# Patient Record
Sex: Female | Born: 1990
Health system: Southern US, Community
[De-identification: ages and names within clinical notes are randomized; demographics above are authoritative.]

## PROBLEM LIST (undated history)

## (undated) DIAGNOSIS — D649 Anemia, unspecified: Secondary | ICD-10-CM

## (undated) HISTORY — DX: Anemia, unspecified: D64.9

## (undated) HISTORY — PX: NO PAST SURGERIES: SHX2092

---

## 2013-08-01 ENCOUNTER — Encounter (HOSPITAL_BASED_OUTPATIENT_CLINIC_OR_DEPARTMENT_OTHER): Payer: Self-pay | Admitting: Emergency Medicine

## 2013-08-01 ENCOUNTER — Emergency Department (HOSPITAL_BASED_OUTPATIENT_CLINIC_OR_DEPARTMENT_OTHER)
Admission: EM | Admit: 2013-08-01 | Discharge: 2013-08-02 | Disposition: A | Discharge status: Home / Self Care | Payer: BC Managed Care – PPO | Attending: Emergency Medicine | Admitting: Emergency Medicine

## 2013-08-01 ENCOUNTER — Emergency Department (HOSPITAL_BASED_OUTPATIENT_CLINIC_OR_DEPARTMENT_OTHER): Payer: BC Managed Care – PPO

## 2013-08-01 DIAGNOSIS — J159 Unspecified bacterial pneumonia: Secondary | ICD-10-CM | POA: Insufficient documentation

## 2013-08-01 DIAGNOSIS — Z3202 Encounter for pregnancy test, result negative: Secondary | ICD-10-CM | POA: Insufficient documentation

## 2013-08-01 DIAGNOSIS — D509 Iron deficiency anemia, unspecified: Secondary | ICD-10-CM | POA: Insufficient documentation

## 2013-08-01 DIAGNOSIS — I498 Other specified cardiac arrhythmias: Secondary | ICD-10-CM | POA: Insufficient documentation

## 2013-08-01 DIAGNOSIS — D473 Essential (hemorrhagic) thrombocythemia: Secondary | ICD-10-CM | POA: Insufficient documentation

## 2013-08-01 DIAGNOSIS — J189 Pneumonia, unspecified organism: Secondary | ICD-10-CM

## 2013-08-01 DIAGNOSIS — D75839 Thrombocytosis, unspecified: Secondary | ICD-10-CM

## 2013-08-01 DIAGNOSIS — R000 Tachycardia, unspecified: Secondary | ICD-10-CM

## 2013-08-01 LAB — PREGNANCY, URINE: Preg Test, Ur: NEGATIVE

## 2013-08-01 MED ORDER — SODIUM CHLORIDE 0.9 % IV BOLUS (SEPSIS)
1000.0000 mL | Freq: Once | INTRAVENOUS | Status: AC
  Administered 2013-08-01: 1000 mL via INTRAVENOUS

## 2013-08-01 NOTE — ED Provider Notes (Signed)
CSN: [id number]     Arrival date & time 08/01/13  2222 History   First MD Initiated Contact with Patient 08/01/13 2237     Chief Complaint  Patient presents with  . elevated heart rate      (Consider location/radiation/quality/duration/timing/severity/associated sxs/prior Treatment) HPI  Karen Bender is a 23 y.o. female who is otherwise healthy complaining of palpitations, shortness of breath acute onset at approximately 9 PM tonight. Patient states she has not drank much liquids today. Patient states she felt like her heart stopped earlier today. Patient denies chest pain, calf pain or swelling, history of DVT or PE, nausea, vomiting, cough, fever, chills, nausea, vomiting, change in bowel or bladder habits, exogenous estrogen, recent long trips or surgeries.  History reviewed. No pertinent past medical history. History reviewed. No pertinent past surgical history. No family history on file. History  Substance Use Topics  . Smoking status: Never Smoker   . Smokeless tobacco: Not on file  . Alcohol Use: No   OB History   Grav Para Term Preterm Abortions TAB SAB Ect Mult Living                 Review of Systems  10 systems reviewed and found to be negative, except as noted in the HPI.   Allergies  Review of patient's allergies indicates no known allergies.  Home Medications   Prior to Admission medications   Not on File   BP 112/65  Pulse 119  Temp(Src) 98.3 F (36.8 C)  Resp 18  Ht 5\' 3"  (1.6 m)  Wt 80 lb (36.288 kg)  BMI 14.18 kg/m2  SpO2 100%  LMP 07/23/2013 Physical Exam  Nursing note and vitals reviewed. Constitutional: She is oriented to person, place, and time. She appears well-developed and well-nourished. No distress.  HENT:  Head: Normocephalic.  Mouth/Throat: Oropharynx is clear and moist.  Eyes: Conjunctivae and EOM are normal. Pupils are equal, round, and reactive to light.  Neck: Normal range of motion. Thyromegaly: .npmdm.  Cardiovascular:  Regular rhythm and intact distal pulses.   Tachycardic in the 120s  Pulmonary/Chest: Effort normal and breath sounds normal. No stridor. No respiratory distress. She has no wheezes. She has no rales. She exhibits no tenderness.  Abdominal: Soft. Bowel sounds are normal. She exhibits no distension and no mass. There is no tenderness. There is no rebound and no guarding.  Musculoskeletal: Normal range of motion. She exhibits no edema.  No calf asymmetry, superficial collaterals, palpable cords, edema, Homans sign negative bilaterally.    Neurological: She is alert and oriented to person, place, and time.  Psychiatric: She has a normal mood and affect.    ED Course  Procedures (including critical care time) Labs Review Labs Reviewed  CBC WITH DIFFERENTIAL - Abnormal; Notable for the following:    Hemoglobin 9.3 (*)    HCT 31.1 (*)    MCV 63.3 (*)    MCH 18.9 (*)    MCHC 29.9 (*)    RDW 16.5 (*)    Platelets 525 (*)    All other components within normal limits  BASIC METABOLIC PANEL - Abnormal; Notable for the following:    Glucose, Bld 107 (*)    All other components within normal limits  PREGNANCY, URINE    Imaging Review Dg Chest 2 View  08/01/2013   CLINICAL DATA:  Tachycardia with shortness of breath.  EXAM: CHEST  2 VIEW  COMPARISON:  None.  FINDINGS: The heart size and mediastinal contours are normal.  There is patchy right perihilar opacity on the frontal examination, not well seen on the lateral view, but probably in the middle lobe. The left lung is clear. There is no pleural effusion or pneumothorax. There is a narrow AP diameter of the chest with a probable pectus excavatum deformity. No acute osseous findings are evident. Telemetry leads overlie the chest.  IMPRESSION: Patchy right perihilar airspace disease suspicious for atelectasis or early pneumonia. No cardiomegaly or edema identified.   Electronically Signed   By: Camie Patience M.D.   On: 08/01/2013 23:57     EKG  Interpretation   Date/Time:  Sunday August 01 2013 23:09:13 EDT Ventricular Rate:  104 PR Interval:  158 QRS Duration: 82 QT Interval:  346 QTC Calculation: 454 R Axis:   47 Text Interpretation:  Sinus tachycardia Nonspecific T wave abnormality  Abnormal ECG No old tracing to compare Confirmed by [facility]  MD, DAVID  ([id number]) on 08/01/2013 11:19:56 PM      MDM   Final diagnoses:  None    Filed Vitals:   08/01/13 2233 08/01/13 2235 08/01/13 2327  BP:  112/65   Pulse: 119    Temp:   98.3 F (36.8 C)  Resp: 18    Height: 5\' 3"  (1.6 m)    Weight: 80 lb (36.288 kg)    SpO2: 100%      Medications  sodium chloride 0.9 % bolus 1,000 mL (1,000 mLs Intravenous New Bag/Given 08/01/13 2345)  cefTRIAXone (ROCEPHIN) 1 g in dextrose 5 % 50 mL IVPB (1 g Intravenous New Bag/Given 08/02/13 0042)  LORazepam (ATIVAN) injection 0.5 mg (not administered)  azithromycin (ZITHROMAX) tablet 500 mg (500 mg Oral Given 08/02/13 0041)  cefTRIAXone (ROCEPHIN) 1 G injection (  Duplicate 07/08/61 8937)    Karen Bender is a 23 y.o. female presenting with tachycardia and shortness of breath. Patient states she has not drink much liquid today and she has also had caffeinated tea. Physical exam is not consistent with DVT and patient has no risk factors.   Chest x-ray may show early right-sided pneumonia. Patient will be started on Rocephin and azithromycin.  Mild anemia at 9.3 over 31.1.  Patient's heart rate has increased to 160, will recheck EKG and give Ativan. MD aware, care transferred  Dr. Roxanne Mins at shift change       Monico Blitz, PA-C 08/02/13 [phone]

## 2013-08-01 NOTE — ED Notes (Signed)
Pt. States she was sitting at home when she started feeling her heart race and felt sob. Denies any recent long car rides or air travel. Denies any dizziness or chest pain. On arrival pt. States she feels a little sob. HR 119 on tele. Denies any new medications. States she drank tea with caffeine today.

## 2013-08-02 LAB — CBC WITH DIFFERENTIAL/PLATELET
Basophils Absolute: 0.1 10*3/uL (ref 0.0–0.1)
Basophils Relative: 1 % (ref 0–1)
Eosinophils Absolute: 0.2 10*3/uL (ref 0.0–0.7)
Eosinophils Relative: 2 % (ref 0–5)
HEMATOCRIT: 31.1 % — AB (ref 36.0–46.0)
Hemoglobin: 9.3 g/dL — ABNORMAL LOW (ref 12.0–15.0)
LYMPHS ABS: 2.1 10*3/uL (ref 0.7–4.0)
Lymphocytes Relative: 26 % (ref 12–46)
MCH: 18.9 pg — ABNORMAL LOW (ref 26.0–34.0)
MCHC: 29.9 g/dL — AB (ref 30.0–36.0)
MCV: 63.3 fL — ABNORMAL LOW (ref 78.0–100.0)
Monocytes Absolute: 0.6 10*3/uL (ref 0.1–1.0)
Monocytes Relative: 7 % (ref 3–12)
NEUTROS ABS: 4.9 10*3/uL (ref 1.7–7.7)
Neutrophils Relative %: 64 % (ref 43–77)
Platelets: 525 10*3/uL — ABNORMAL HIGH (ref 150–400)
RBC: 4.91 MIL/uL (ref 3.87–5.11)
RDW: 16.5 % — ABNORMAL HIGH (ref 11.5–15.5)
WBC: 7.9 10*3/uL (ref 4.0–10.5)

## 2013-08-02 LAB — BASIC METABOLIC PANEL
Anion gap: 13 (ref 5–15)
BUN: 10 mg/dL (ref 6–23)
CALCIUM: 10 mg/dL (ref 8.4–10.5)
CO2: 25 mEq/L (ref 19–32)
CREATININE: 0.7 mg/dL (ref 0.50–1.10)
Chloride: 102 mEq/L (ref 96–112)
GFR calc Af Amer: >90 mL/min (ref >90)
GFR calc non Af Amer: >90 mL/min (ref >90)
Glucose, Bld: 107 mg/dL — ABNORMAL HIGH (ref 70–99)
Potassium: 4 mEq/L (ref 3.7–5.3)
Sodium: 140 mEq/L (ref 137–147)

## 2013-08-02 MED ORDER — DEXTROSE 5 % IV SOLN
1.0000 g | Freq: Once | INTRAVENOUS | Status: AC
  Administered 2013-08-02: 1 g via INTRAVENOUS

## 2013-08-02 MED ORDER — AZITHROMYCIN 250 MG PO TABS: 1000.0000 mg | ORAL_TABLET | Freq: Once | ORAL | Status: DC

## 2013-08-02 MED ORDER — CEFTRIAXONE SODIUM 1 G IJ SOLR
INTRAMUSCULAR | Status: AC
  Filled 2013-08-02: qty 10

## 2013-08-02 MED ORDER — LORAZEPAM 2 MG/ML IJ SOLN
0.5000 mg | Freq: Once | INTRAMUSCULAR | Status: AC
Start: 2013-08-02 — End: 2013-08-02
  Administered 2013-08-02: 0.5 mg via INTRAVENOUS
  Filled 2013-08-02: qty 1

## 2013-08-02 MED ORDER — AMOXICILLIN 500 MG PO CAPS: 1000.0000 mg | ORAL_CAPSULE | Freq: Two times a day (BID) | ORAL | Status: DC

## 2013-08-02 MED ORDER — SODIUM CHLORIDE 0.9 % IV BOLUS (SEPSIS)
1000.0000 mL | Freq: Once | INTRAVENOUS | Status: AC
  Administered 2013-08-02: 1000 mL via INTRAVENOUS

## 2013-08-02 MED ORDER — AZITHROMYCIN 250 MG PO TABS
500.0000 mg | ORAL_TABLET | Freq: Once | ORAL | Status: AC
  Administered 2013-08-02: 500 mg via ORAL
  Filled 2013-08-02: qty 2

## 2013-08-02 NOTE — ED Notes (Signed)
Pt. Understands the importance of finding a PCP for primary care. Resource numbers given to pt.

## 2013-08-02 NOTE — ED Provider Notes (Signed)
23 year old female came in because of dyspnea and rapid heartbeat. On exam, lungs are clear heart is regular rate and rhythm although tachycardic. She is afebrile. Chest x-ray showed community acquired pneumonia she was started on appropriate antibiotics. However, because of persistent tachycardia, she was given aggressive IV hydration with progressive improvement in heart rate to the point where should her heart rate was now under 100. At no time did she appear toxic in any way. She is discharged with prescription for amoxicillin and is to be rechecked in the next 24-48 hours.  Medical screening examination/treatment/procedure(s) were conducted as a shared visit with non-physician practitioner(s) and myself.  I personally evaluated the patient during the encounter.   EKG Interpretation   Date/Time:  Sunday August 01 2013 23:09:13 EDT Ventricular Rate:  104 PR Interval:  158 QRS Duration: 82 QT Interval:  346 QTC Calculation: 676 R Axis:   47 Text Interpretation:  Sinus tachycardia Nonspecific T wave abnormality  Abnormal ECG No old tracing to compare Confirmed by [facility]  MD, Jacqui Headen  ([id number]) on 08/01/2013 11:19:56 PM       EKG Interpretation   Date/Time:  Monday August 02 2013 00:39:37 EDT Ventricular Rate:  134 PR Interval:  134 QRS Duration: 78 QT Interval:  284 QTC Calculation: 424 R Axis:   57 Text Interpretation:  Sinus tachycardia ST \\T \ T wave abnormality,  consider inferior ischemia Abnormal ECG When compared with ECG of  08/01/2013, No significant change was found Confirmed by [facility]  MD, Joy Haegele  ([id number]) on 08/02/2013 2:03:51 AM      Results for orders placed during the hospital encounter of 08/01/13  CBC WITH DIFFERENTIAL      Result Value Ref Range   WBC 7.9  4.0 - 10.5 K/uL   RBC 4.91  3.87 - 5.11 MIL/uL   Hemoglobin 9.3 (*) 12.0 - 15.0 g/dL   HCT 31.1 (*) 36.0 - 46.0 %   MCV 63.3 (*) 78.0 - 100.0 fL   MCH 18.9 (*) 26.0 - 34.0 pg   MCHC 29.9 (*) 30.0 - 36.0 g/dL   RDW  16.5 (*) 11.5 - 15.5 %   Platelets 525 (*) 150 - 400 K/uL   Neutrophils Relative % 64  43 - 77 %   Lymphocytes Relative 26  12 - 46 %   Monocytes Relative 7  3 - 12 %   Eosinophils Relative 2  0 - 5 %   Basophils Relative 1  0 - 1 %   Neutro Abs 4.9  1.7 - 7.7 K/uL   Lymphs Abs 2.1  0.7 - 4.0 K/uL   Monocytes Absolute 0.6  0.1 - 1.0 K/uL   Eosinophils Absolute 0.2  0.0 - 0.7 K/uL   Basophils Absolute 0.1  0.0 - 0.1 K/uL   RBC Morphology ELLIPTOCYTES     WBC Morphology WHITE COUNT CONFIRMED ON SMEAR     Smear Review PLATELET COUNT CONFIRMED BY SMEAR    BASIC METABOLIC PANEL      Result Value Ref Range   Sodium 140  137 - 147 mEq/L   Potassium 4.0  3.7 - 5.3 mEq/L   Chloride 102  96 - 112 mEq/L   CO2 25  19 - 32 mEq/L   Glucose, Bld 107 (*) 70 - 99 mg/dL   BUN 10  6 - 23 mg/dL   Creatinine, Ser 0.70  0.50 - 1.10 mg/dL   Calcium 10.0  8.4 - 10.5 mg/dL   GFR calc non Af Amer >90  >  90 mL/min   GFR calc Af Amer >90  >90 mL/min   Anion gap 13  5 - 15  PREGNANCY, URINE      Result Value Ref Range   Preg Test, Ur NEGATIVE  NEGATIVE   Dg Chest 2 View  08/01/2013   CLINICAL DATA:  Tachycardia with shortness of breath.  EXAM: CHEST  2 VIEW  COMPARISON:  None.  FINDINGS: The heart size and mediastinal contours are normal. There is patchy right perihilar opacity on the frontal examination, not well seen on the lateral view, but probably in the middle lobe. The left lung is clear. There is no pleural effusion or pneumothorax. There is a narrow AP diameter of the chest with a probable pectus excavatum deformity. No acute osseous findings are evident. Telemetry leads overlie the chest.  IMPRESSION: Patchy right perihilar airspace disease suspicious for atelectasis or early pneumonia. No cardiomegaly or edema identified.   Electronically Signed   By: Camie Patience M.D.   On: 08/01/2013 23:57    Images viewed by me.   Delora Fuel, MD 68/11/57 [id number]

## 2013-08-02 NOTE — Discharge Instructions (Signed)
You should be rechecked in the next 1-2 days. Return to the ED if symptoms are getting worse. Drink plenty of fluids.  Pneumonia, Adult Pneumonia is an infection of the lungs.  CAUSES Pneumonia may be caused by bacteria or a virus. Usually, these infections are caused by breathing infectious particles into the lungs (respiratory tract). SYMPTOMS   Cough.  Fever.  Chest pain.  Increased rate of breathing.  Wheezing.  Mucus production. DIAGNOSIS  If you have the common symptoms of pneumonia, your caregiver will typically confirm the diagnosis with a chest X-ray. The X-ray will show an abnormality in the lung (pulmonary infiltrate) if you have pneumonia. Other tests of your blood, urine, or sputum may be done to find the specific cause of your pneumonia. Your caregiver may also do tests (blood gases or pulse oximetry) to see how well your lungs are working. TREATMENT  Some forms of pneumonia may be spread to other people when you cough or sneeze. You may be asked to wear a mask before and during your exam. Pneumonia that is caused by bacteria is treated with antibiotic medicine. Pneumonia that is caused by the influenza virus may be treated with an antiviral medicine. Most other viral infections must run their course. These infections will not respond to antibiotics.  PREVENTION A pneumococcal shot (vaccine) is available to prevent a common bacterial cause of pneumonia. This is usually suggested for:  People over 25 years old.  Patients on chemotherapy.  People with chronic lung problems, such as bronchitis or emphysema.  People with immune system problems. If you are over 65 or have a high risk condition, you may receive the pneumococcal vaccine if you have not received it before. In some countries, a routine influenza vaccine is also recommended. This vaccine can help prevent some cases of pneumonia.You may be offered the influenza vaccine as part of your care. If you smoke, it is  time to quit. You may receive instructions on how to stop smoking. Your caregiver can provide medicines and counseling to help you quit. HOME CARE INSTRUCTIONS   Cough suppressants may be used if you are losing too much rest. However, coughing protects you by clearing your lungs. You should avoid using cough suppressants if you can.  Your caregiver may have prescribed medicine if he or she thinks your pneumonia is caused by a bacteria or influenza. Finish your medicine even if you start to feel better.  Your caregiver may also prescribe an expectorant. This loosens the mucus to be coughed up.  Only take over-the-counter or prescription medicines for pain, discomfort, or fever as directed by your caregiver.  Do not smoke. Smoking is a common cause of bronchitis and can contribute to pneumonia. If you are a smoker and continue to smoke, your cough may last several weeks after your pneumonia has cleared.  A cold steam vaporizer or humidifier in your room or home may help loosen mucus.  Coughing is often worse at night. Sleeping in a semi-upright position in a recliner or using a couple pillows under your head will help with this.  Get rest as you feel it is needed. Your body will usually let you know when you need to rest. SEEK IMMEDIATE MEDICAL CARE IF:   Your illness becomes worse. This is especially true if you are elderly or weakened from any other disease.  You cannot control your cough with suppressants and are losing sleep.  You begin coughing up blood.  You develop pain which is getting  worse or is uncontrolled with medicines.  You have a fever.  Any of the symptoms which initially brought you in for treatment are getting worse rather than better.  You develop shortness of breath or chest pain. MAKE SURE YOU:   Understand these instructions.  Will watch your condition.  Will get help right away if you are not doing well or get worse. Document Released: 12/31/2004 Document  Revised: 03/25/2011 Document Reviewed: 03/22/2010 [facility] Patient Information 2015 [place], [place]. This information is not intended to replace advice given to you by your health care provider. Make sure you discuss any questions you have with your health care provider.  Amoxicillin capsules or tablets What is this medicine? AMOXICILLIN (a mox i SIL in) is a penicillin antibiotic. It is used to treat certain kinds of bacterial infections. It will not work for colds, flu, or other viral infections. This medicine may be used for other purposes; ask your health care provider or pharmacist if you have questions. COMMON BRAND NAME(S): Amoxil, Moxilin, Sumox, Trimox What should I tell my health care provider before I take this medicine? They need to know if you have any of these conditions: -asthma -kidney disease -an unusual or allergic reaction to amoxicillin, other penicillins, cephalosporin antibiotics, other medicines, foods, dyes, or preservatives -pregnant or trying to get pregnant -breast-feeding How should I use this medicine? Take this medicine by mouth with a glass of water. Follow the directions on your prescription label. You may take this medicine with food or on an empty stomach. Take your medicine at regular intervals. Do not take your medicine more often than directed. Take all of your medicine as directed even if you think your are better. Do not skip doses or stop your medicine early. Talk to your pediatrician regarding the use of this medicine in children. While this drug may be prescribed for selected conditions, precautions do apply. Overdosage: If you think you have taken too much of this medicine contact a poison control center or emergency room at once. NOTE: This medicine is only for you. Do not share this medicine with others. What if I miss a dose? If you miss a dose, take it as soon as you can. If it is almost time for your next dose, take only that dose. Do not take  double or extra doses. What may interact with this medicine? -amiloride -birth control pills -chloramphenicol -macrolides -probenecid -sulfonamides -tetracyclines This list may not describe all possible interactions. Give your health care provider a list of all the medicines, herbs, non-prescription drugs, or dietary supplements you use. Also tell them if you smoke, drink alcohol, or use illegal drugs. Some items may interact with your medicine. What should I watch for while using this medicine? Tell your doctor or health care professional if your symptoms do not improve in 2 or 3 days. Take all of the doses of your medicine as directed. Do not skip doses or stop your medicine early. If you are diabetic, you may get a false positive result for sugar in your urine with certain brands of urine tests. Check with your doctor. Do not treat diarrhea with over-the-counter products. Contact your doctor if you have diarrhea that lasts more than 2 days or if the diarrhea is severe and watery. What side effects may I notice from receiving this medicine? Side effects that you should report to your doctor or health care professional as soon as possible: -allergic reactions like skin rash, itching or hives, swelling of the face,  lips, or tongue -breathing problems -dark urine -redness, blistering, peeling or loosening of the skin, including inside the mouth -seizures -severe or watery diarrhea -trouble passing urine or change in the amount of urine -unusual bleeding or bruising -unusually weak or tired -yellowing of the eyes or skin Side effects that usually do not require medical attention (report to your doctor or health care professional if they continue or are bothersome): -dizziness -headache -stomach upset -trouble sleeping This list may not describe all possible side effects. Call your doctor for medical advice about side effects. You may report side effects to FDA at 1-800-FDA-1088. Where  should I keep my medicine? Keep out of the reach of children. Store between 68 and 77 degrees F (20 and 25 degrees C). Keep bottle closed tightly. Throw away any unused medicine after the expiration date. NOTE: This sheet is a summary. It may not cover all possible information. If you have questions about this medicine, talk to your doctor, pharmacist, or health care provider.  2015, Elsevier/Gold Standard. (2007-03-24 14:10:59)

## 2013-08-19 ENCOUNTER — Ambulatory Visit: Payer: BC Managed Care – PPO | Admitting: Internal Medicine

## 2018-11-25 ENCOUNTER — Other Ambulatory Visit: Payer: Self-pay

## 2018-11-25 DIAGNOSIS — Z20822 Contact with and (suspected) exposure to covid-19: Secondary | ICD-10-CM

## 2018-11-27 LAB — NOVEL CORONAVIRUS, NAA: SARS-CoV-2, NAA: NOT DETECTED

## 2019-03-28 DIAGNOSIS — Z20828 Contact with and (suspected) exposure to other viral communicable diseases: Secondary | ICD-10-CM | POA: Diagnosis not present

## 2019-03-28 DIAGNOSIS — Z03818 Encounter for observation for suspected exposure to other biological agents ruled out: Secondary | ICD-10-CM | POA: Diagnosis not present

## 2019-05-04 DIAGNOSIS — J302 Other seasonal allergic rhinitis: Secondary | ICD-10-CM | POA: Diagnosis not present

## 2019-06-03 DIAGNOSIS — D509 Iron deficiency anemia, unspecified: Secondary | ICD-10-CM | POA: Diagnosis not present

## 2019-06-03 DIAGNOSIS — Z23 Encounter for immunization: Secondary | ICD-10-CM | POA: Diagnosis not present

## 2019-06-03 DIAGNOSIS — Z0000 Encounter for general adult medical examination without abnormal findings: Secondary | ICD-10-CM | POA: Diagnosis not present

## 2019-08-18 DIAGNOSIS — Z23 Encounter for immunization: Secondary | ICD-10-CM | POA: Diagnosis not present

## 2019-09-09 DIAGNOSIS — Z23 Encounter for immunization: Secondary | ICD-10-CM | POA: Diagnosis not present

## 2019-11-02 ENCOUNTER — Encounter: Payer: BLUE CROSS/BLUE SHIELD | Admitting: Certified Nurse Midwife

## 2019-11-03 ENCOUNTER — Other Ambulatory Visit: Payer: Self-pay

## 2019-11-03 ENCOUNTER — Other Ambulatory Visit (HOSPITAL_COMMUNITY)
Admission: RE | Admit: 2019-11-03 | Discharge: 2019-11-03 | Disposition: A | Discharge status: Home / Self Care | Payer: BLUE CROSS/BLUE SHIELD | Source: Ambulatory Visit | Attending: Obstetrics and Gynecology | Admitting: Obstetrics and Gynecology

## 2019-11-03 ENCOUNTER — Ambulatory Visit (INDEPENDENT_AMBULATORY_CARE_PROVIDER_SITE_OTHER): Payer: BLUE CROSS/BLUE SHIELD | Admitting: Obstetrics and Gynecology

## 2019-11-03 ENCOUNTER — Encounter: Payer: Self-pay | Admitting: Obstetrics and Gynecology

## 2019-11-03 VITALS — BP 120/76 | HR 108 | Ht 63.0 in | Wt 98.0 lb

## 2019-11-03 DIAGNOSIS — Z1329 Encounter for screening for other suspected endocrine disorder: Secondary | ICD-10-CM | POA: Diagnosis not present

## 2019-11-03 DIAGNOSIS — Z01419 Encounter for gynecological examination (general) (routine) without abnormal findings: Secondary | ICD-10-CM | POA: Diagnosis not present

## 2019-11-03 NOTE — Progress Notes (Signed)
    GYNECOLOGY ANNUAL PREVENTATIVE CARE ENCOUNTER NOTE  History:     Karen Bender is a 29 y.o. G0P0000 female here for a routine annual gynecologic exam.  Current complaints: None. First pap smear .   Denies abnormal vaginal bleeding, discharge, pelvic pain, problems with intercourse or other gynecologic concerns.    Gynecologic History Patient's last menstrual period was 10/12/2019. Contraception: none Last Pap: NA.  Last mammogram:NA  Obstetric History OB History  Gravida Para Term Preterm AB Living  0 0 0 0 0 0  SAB TAB Ectopic Multiple Live Births  0 0 0 0 0    Past Medical History:  Diagnosis Date  . Anemia     History reviewed. No pertinent surgical history.  Current Outpatient Medications on File Prior to Visit  Medication Sig Dispense Refill  . ferrous sulfate 325 (65 FE) MG tablet Take by mouth. (Patient not taking: Reported on 11/03/2019)     No current facility-administered medications on file prior to visit.    No Known Allergies  Social History:  reports that she has never smoked. She has never used smokeless tobacco. She reports that she does not drink alcohol and does not use drugs.  History reviewed. No pertinent family history.  The following portions of the patient's history were reviewed and updated as appropriate: allergies, current medications, past family history, past medical history, past social history, past surgical history and problem list.  Review of Systems Pertinent items noted in HPI and remainder of comprehensive ROS otherwise negative.  Physical Exam:  BP 120/76   Pulse (!) 108   Ht 5\' 3"  (1.6 m)   Wt 98 lb (44.5 kg)   LMP 10/12/2019   BMI 17.36 kg/m  CONSTITUTIONAL: Well-developed, well-nourished female in no acute distress.  HENT:  Normocephalic, atraumatic, External right and left ear normal. Oropharynx is clear and moist EYES: Conjunctivae and EOM are normal. Pupils are equal, round, and reactive to light. No scleral  icterus.  NECK: Normal range of motion, supple, no masses.  Normal thyroid.  SKIN: Skin is warm and dry. No rash noted. Not diaphoretic. No erythema. No pallor. MUSCULOSKELETAL: Normal range of motion. No tenderness.  No cyanosis, clubbing, or edema.  2+ distal pulses. NEUROLOGIC: Alert and oriented to person, place, and time. Normal reflexes, muscle tone coordination.  PSYCHIATRIC: Normal mood and affect. Normal behavior. Normal judgment and thought content. CARDIOVASCULAR: Normal heart rate noted, regular rhythm RESPIRATORY: Clear to auscultation bilaterally. Effort and breath sounds normal, no problems with respiration noted. BREASTS: Symmetric in size. No masses, tenderness, skin changes, nipple drainage, or lymphadenopathy bilaterally. Performed in the presence of a chaperone. ABDOMEN: Soft, no distention noted.  No tenderness, rebound or guarding.  PELVIC: Normal appearing external genitalia and urethral meatus; normal appearing vaginal mucosa and cervix.  No abnormal discharge noted.  Pap smear obtained.  Normal uterine size, no other palpable masses, no uterine or adnexal tenderness.  Performed in the presence of a chaperone.   Assessment and Plan:   1. Well woman exam  - Cytology - PAP( ) - TSH   Will follow up results of pap smear and manage accordingly. Routine preventative health maintenance measures emphasized. Please refer to After Visit Summary for other counseling recommendations.    Azzure Garabedian, Artist Pais, [place] for [employer], [place]

## 2019-11-03 NOTE — Progress Notes (Signed)
Pt would like to discuss getting pregnant Pt has never had pap smear

## 2019-11-04 LAB — TSH: TSH: 1.24 mIU/L

## 2019-11-04 LAB — CYTOLOGY - PAP: Diagnosis: NEGATIVE

## 2019-12-02 DIAGNOSIS — R1030 Lower abdominal pain, unspecified: Secondary | ICD-10-CM | POA: Diagnosis not present

## 2019-12-10 ENCOUNTER — Emergency Department
Admission: EM | Admit: 2019-12-10 | Discharge: 2019-12-10 | Disposition: A | Discharge status: Home / Self Care | Payer: BLUE CROSS/BLUE SHIELD | Source: Home / Self Care

## 2019-12-10 ENCOUNTER — Other Ambulatory Visit: Payer: Self-pay

## 2019-12-10 DIAGNOSIS — R1030 Lower abdominal pain, unspecified: Secondary | ICD-10-CM | POA: Diagnosis not present

## 2019-12-10 LAB — POCT URINALYSIS DIP (MANUAL ENTRY)
Bilirubin, UA: NEGATIVE
Glucose, UA: NEGATIVE mg/dL
Ketones, POC UA: NEGATIVE mg/dL
Leukocytes, UA: NEGATIVE
Nitrite, UA: NEGATIVE
Protein Ur, POC: NEGATIVE mg/dL
Spec Grav, UA: 1.025 (ref 1.010–1.025)
Urobilinogen, UA: 0.2 E.U./dL
pH, UA: 6 (ref 5.0–8.0)

## 2019-12-10 LAB — POCT URINE PREGNANCY: Preg Test, Ur: NEGATIVE

## 2019-12-10 MED ORDER — NAPROXEN 375 MG PO TABS: 375.0000 mg | ORAL_TABLET | Freq: Two times a day (BID) | ORAL | 0 refills | Status: DC

## 2019-12-10 NOTE — ED Triage Notes (Addendum)
Pt presents to Urgent Care with c/o abdominal pain to RLQ x approx 4 weeks. She reports the pain is mostly dull but occasionally sharp. Denies N/V. Reports having normal abdominal x-ray and UA on 12/02/19 @ [facility]. Pt states she is scheduled for pelvic ultrasound, but it isn't until 12/27/19.

## 2019-12-10 NOTE — Discharge Instructions (Addendum)
As recommended by previous provider, start Naproxen 375 mg twice daily until ultrasound can be scheduled. Please follow-up with [facility] in [place] for further work. Also get established with a primary care provider. I have attached information to get established with Primary Care at [facility]

## 2019-12-10 NOTE — ED Provider Notes (Signed)
Karen Bender CARE    CSN: [id number] Arrival date & time: 12/10/19  1138      History   Chief Complaint Chief Complaint  Patient presents with  . Abdominal Pain    HPI Karen Bender is a 29 y.o. female.   HPI  Patient presents today requesting an abdominal ultrasound of the RLQ of her abdomen. Patient has been seen and evaluated for this problem through [facility] and is scheduled to have an [place] on 12/27/19. In review of EMR, patient was advised to start anti-inflammatory for pain management , however, patient reports that she is not experiencing any abdominal pain presently and never has had pain to the severity in which she required antiinflammatory medication. She denies abdominal pain, dysuria, nausea, vomiting, or any active symptoms. Patient reports that she desires to have imaging sooner. Past Medical History:  Diagnosis Date  . Anemia     There are no problems to display for this patient.   History reviewed. No pertinent surgical history.  OB History    Gravida  0   Para  0   Term  0   Preterm  0   AB  0   Living  0     SAB  0   TAB  0   Ectopic  0   Multiple  0   Live Births  0            Home Medications    Prior to Admission medications   Medication Sig Start Date End Date Taking? Authorizing Provider  ferrous sulfate 325 (65 FE) MG tablet Take by mouth. Patient not taking: Reported on 11/03/2019 06/04/19   [provider]    Family History Family History  Problem Relation Age of Onset  . Diabetes Mother   . Hypertension Mother   . Healthy Father     Social History Social History   Tobacco Use  . Smoking status: Never Smoker  . Smokeless tobacco: Never Used  Substance Use Topics  . Alcohol use: No  . Drug use: Never     Allergies   Patient has no known allergies.   Review of Systems Review of Systems   Physical Exam Triage Vital Signs ED Triage Vitals  Enc Vitals Group     BP 12/10/19 1341  (!) 141/91     Pulse Rate 12/10/19 1341 94     Resp 12/10/19 1341 20     Temp 12/10/19 1341 97.8 F (36.6 C)     Temp Source 12/10/19 1341 Oral     SpO2 12/10/19 1341 98 %     Weight --      Height --      Head Circumference --      Peak Flow --      Pain Score 12/10/19 1338 6     Pain Loc --      Pain Edu? --      Excl. in [place]? --    No data found.  Updated Vital Signs BP (!) 141/91 (BP Location: Right Arm)   Pulse 94   Temp 97.8 F (36.6 C) (Oral)   Resp 20   LMP 12/08/2019 (Exact Date)   SpO2 98%   Visual Acuity Right Eye Distance:   Left Eye Distance:   Bilateral Distance:    Right Eye Near:   Left Eye Near:    Bilateral Near:     Physical Exam General appearance: alert, well developed, well nourished, cooperative and in no  distress Head: Normocephalic, without obvious abnormality, atraumatic Respiratory: Respirations even and unlabored, normal respiratory rate Heart: rate and rhythm normal. No gallop or murmurs noted on exam  Abdomen: BS +, no distention, no rebound tenderness, or no mass Extremities: No gross deformities Skin: Skin color, texture, turgor normal. No rashes seen  Psych: Appropriate mood and affect. UC Treatments / Results  Labs (all labs ordered are listed, but only abnormal results are displayed) Labs Reviewed  POCT URINALYSIS DIP (MANUAL ENTRY) - Abnormal; Notable for the following components:      Result Value   Blood, UA moderate (*)    All other components within normal limits  POCT URINE PREGNANCY    EKG   Radiology No results found.  Procedures Procedures (including critical care time)  Medications Ordered in UC Medications - No data to display  Initial Impression / Assessment and Plan / UC Course  I have reviewed the triage vital signs and the nursing notes.  Pertinent labs & imaging results that were available during my care of the patient were reviewed by me and considered in my medical decision making (see chart for  details).    Patient advised that urgent care  is not the appropriate setting for formal work-up of intermittent idiopathic lower abdominal pain. UA negative. HCG negative. Patient is currently actively menstruating, however is not experiencing any pain. Provided resources to follow-up with [facility] and get established with a primary care provider. Naprosyn prescribed as needed for pain. ER if symptoms become severe Final Clinical Impressions(s) / UC Diagnoses   Final diagnoses:  Lower abdominal pain     Discharge Instructions     As recommended by previous provider, start Naproxen 375 mg twice daily until ultrasound can be scheduled. Please follow-up with [facility] in [place] for further work. Also get established with a primary care provider. I have attached information to get established with Primary Care at [facility]    ED Prescriptions    Medication Sig Dispense Auth. Provider   naproxen (NAPROSYN) 375 MG tablet Take 1 tablet (375 mg total) by mouth 2 (two) times daily. 20 tablet Scot Jun, FNP     PDMP not reviewed this encounter.   Scot Jun, [place] 12/14/19 [phone]

## 2019-12-23 ENCOUNTER — Ambulatory Visit: Payer: BLUE CROSS/BLUE SHIELD | Admitting: Obstetrics and Gynecology

## 2020-01-15 NOTE — L&D Delivery Note (Addendum)
OB/GYN Faculty Practice Delivery Note  Karen Bender is a 30 y.o. G1P0000 s/p SVD at [redacted]w[redacted]d. She was admitted for eIOL.   ROM: 7h 9m with clear fluid GBS Status: positive; on PCN  Delivery Date/Time: 12/12/2020 at 0103 Delivery: Called to room and patient was complete and pushing. Head delivered LOA. Nuchal cord present and delivered through. Shoulder and body delivered in usual fashion and nuchal cord reduced. Infant with spontaneous cry, placed on mother's abdomen, dried and stimulated. Cord clamped x 2 after 1-minute delay, and cut by CNM. Cord blood drawn. Placenta delivered spontaneously with gentle cord traction. Fundus firm with massage and Pitocin. Labia, perineum, vagina, and cervix were inspected, and the patient was found to have bilateral labial and a 2nd degree perineal tear. The perineal tear was repaired with 3-0 Vicryl.  Placenta: Intact, 3VC - sent to L&D Complications: none Lacerations: bilateral labial, 2nd degree perineal EBL: 300 cc Analgesia: Epidural  Infant: Viable female  APGARs 9, 9   Virgina Norfolk, MD PGY1  The above was performed under my direct supervision and guidance.

## 2020-07-31 ENCOUNTER — Encounter: Payer: Self-pay | Admitting: Obstetrics and Gynecology

## 2020-08-02 ENCOUNTER — Encounter: Payer: Self-pay | Admitting: Obstetrics and Gynecology

## 2020-08-02 NOTE — Progress Notes (Signed)
PHQ 9: Score 0 GAD: Score 0

## 2020-08-03 ENCOUNTER — Encounter: Payer: BC Managed Care – PPO | Admitting: Obstetrics and Gynecology

## 2020-08-10 ENCOUNTER — Encounter: Payer: Self-pay | Admitting: Obstetrics and Gynecology

## 2020-08-10 ENCOUNTER — Other Ambulatory Visit: Payer: Self-pay

## 2020-08-10 ENCOUNTER — Ambulatory Visit (INDEPENDENT_AMBULATORY_CARE_PROVIDER_SITE_OTHER): Payer: BC Managed Care – PPO | Admitting: Obstetrics and Gynecology

## 2020-08-10 ENCOUNTER — Other Ambulatory Visit (HOSPITAL_COMMUNITY)
Admission: RE | Admit: 2020-08-10 | Discharge: 2020-08-10 | Disposition: A | Discharge status: Home / Self Care | Payer: BC Managed Care – PPO | Source: Ambulatory Visit | Attending: Obstetrics and Gynecology | Admitting: Obstetrics and Gynecology

## 2020-08-10 VITALS — BP 100/66 | HR 94 | Wt 101.0 lb

## 2020-08-10 DIAGNOSIS — Z3400 Encounter for supervision of normal first pregnancy, unspecified trimester: Secondary | ICD-10-CM | POA: Diagnosis not present

## 2020-08-10 DIAGNOSIS — Z3402 Encounter for supervision of normal first pregnancy, second trimester: Secondary | ICD-10-CM

## 2020-08-10 DIAGNOSIS — Z3A21 21 weeks gestation of pregnancy: Secondary | ICD-10-CM

## 2020-08-10 NOTE — Progress Notes (Signed)
Pt is here for 1st PNV.  She has been receiving care in [place].  Per pt she has only had 2 U/S but not PN labs

## 2020-08-10 NOTE — Progress Notes (Signed)
   PRENATAL VISIT NOTE  Subjective:  Karen Bender is a 30 y.o. G1P0000 at 38w1dbeing seen today for ongoing prenatal care.  She is currently monitored for the following issues for this low-risk pregnancy and has Supervision of normal first pregnancy on their problem list.  Patient reports no complaints.  Contractions: Not present. Vag. Bleeding: None.  Movement: Present. Denies leaking of fluid.   The following portions of the patient's history were reviewed and updated as appropriate: allergies, current medications, past family history, past medical history, past social history, past surgical history and problem list.   Objective:   Vitals:   08/10/20 1513  BP: 100/66  Pulse: 94  Weight: 101 lb (45.8 kg)    Fetal Status: Fetal Heart Rate (bpm): 160   Movement: Present     General:  Alert, oriented and cooperative. Patient is in no acute distress.  Skin: Skin is warm and dry. No rash noted.   Cardiovascular: Normal heart rate noted  Respiratory: Normal respiratory effort, no problems with respiration noted  Abdomen: Soft, gravid, appropriate for gestational age.  Pain/Pressure: Absent     Pelvic: Cervical exam deferred        Extremities: Normal range of motion.  Edema: None  Mental Status: Normal mood and affect. Normal behavior. Normal judgment and thought content.   Assessment and Plan:  Pregnancy: G1P0000 at 245w1d1. Supervision of normal first pregnancy, antepartum - [place] [facility], fellows, medical students, virtual visits, MyChart.  - prefers female providers but understands she may be taken care of by female physician in hospital or MAU - of note, EMR states she has lost 97 pounds since last weight recorded, patient states this is error, she has never weighed over 100 pounds and now at 101, has gained some weight in pregnancy and has always been slender, reviewed  appropriate weight gain in pregnancy - Obstetric panel - Hepatitis C Antibody - HIV antibody (with reflex) - Culture, OB Urine - GC/Chlamydia probe amp ([place])not at [facility] - Genetic Screening - US[place]FM OB COMP + 14 WK; Future  Preterm labor symptoms and general obstetric precautions including but not limited to vaginal bleeding, contractions, leaking of fluid and fetal movement were reviewed in detail with the patient. Please refer to After Visit Summary for other counseling recommendations.   Return in about 4 weeks (around 09/07/2020) for low OB, in person.  No future appointments.  KeSloan LeiterMD

## 2020-08-11 LAB — OBSTETRIC PANEL
Absolute Monocytes: 687 cells/uL (ref 200–950)
Antibody Screen: NOT DETECTED
Basophils Absolute: 40 cells/uL (ref 0–200)
Basophils Relative: 0.4 %
Eosinophils Absolute: 192 cells/uL (ref 15–500)
Eosinophils Relative: 1.9 %
HCT: 37.2 % (ref 35.0–45.0)
Hemoglobin: 11.8 g/dL (ref 11.7–15.5)
Hepatitis B Surface Ag: NONREACTIVE
Lymphs Abs: 1970 cells/uL (ref 850–3900)
MCH: 25.8 pg — ABNORMAL LOW (ref 27.0–33.0)
MCHC: 31.7 g/dL — ABNORMAL LOW (ref 32.0–36.0)
MCV: 81.2 fL (ref 80.0–100.0)
MPV: 10.1 fL (ref 7.5–12.5)
Monocytes Relative: 6.8 %
Neutro Abs: 7211 cells/uL (ref 1500–7800)
Neutrophils Relative %: 71.4 %
Platelets: 304 10*3/uL (ref 140–400)
RBC: 4.58 10*6/uL (ref 3.80–5.10)
RDW: 19.5 % — ABNORMAL HIGH (ref 11.0–15.0)
RPR Ser Ql: NONREACTIVE
Rubella: 15.9 Index
Total Lymphocyte: 19.5 %
WBC: 10.1 10*3/uL (ref 3.8–10.8)

## 2020-08-11 LAB — HEPATITIS C ANTIBODY
HCV Ab: NEGATIVE
Hepatitis C Ab: NONREACTIVE
SIGNAL TO CUT-OFF: 0.01 (ref <1.00)

## 2020-08-11 LAB — HIV ANTIBODY (ROUTINE TESTING W REFLEX): HIV 1&2 Ab, 4th Generation: NONREACTIVE

## 2020-08-11 LAB — GC/CHLAMYDIA PROBE AMP (~~LOC~~) NOT AT ARMC
Chlamydia: NEGATIVE
Comment: NEGATIVE
Comment: NORMAL
Neisseria Gonorrhea: NEGATIVE

## 2020-08-12 LAB — URINE CULTURE, OB REFLEX

## 2020-08-12 LAB — CULTURE, OB URINE

## 2020-08-21 ENCOUNTER — Encounter: Payer: Self-pay | Admitting: *Deleted

## 2020-08-21 DIAGNOSIS — Z3400 Encounter for supervision of normal first pregnancy, unspecified trimester: Secondary | ICD-10-CM

## 2020-08-21 NOTE — Progress Notes (Signed)
[facility] results scanned and routed to L Leftwich-Kirby,CNM

## 2020-08-23 ENCOUNTER — Other Ambulatory Visit: Payer: Self-pay | Admitting: *Deleted

## 2020-08-23 ENCOUNTER — Other Ambulatory Visit: Payer: Self-pay

## 2020-08-23 ENCOUNTER — Ambulatory Visit: Payer: BC Managed Care – PPO | Attending: Obstetrics and Gynecology

## 2020-08-23 DIAGNOSIS — Z3400 Encounter for supervision of normal first pregnancy, unspecified trimester: Secondary | ICD-10-CM | POA: Diagnosis present

## 2020-08-23 DIAGNOSIS — Z362 Encounter for other antenatal screening follow-up: Secondary | ICD-10-CM

## 2020-08-23 DIAGNOSIS — O358XX0 Maternal care for other (suspected) fetal abnormality and damage, not applicable or unspecified: Secondary | ICD-10-CM

## 2020-08-23 DIAGNOSIS — O35EXX0 Maternal care for other (suspected) fetal abnormality and damage, fetal genitourinary anomalies, not applicable or unspecified: Secondary | ICD-10-CM

## 2020-09-08 ENCOUNTER — Other Ambulatory Visit: Payer: Self-pay

## 2020-09-08 ENCOUNTER — Ambulatory Visit (INDEPENDENT_AMBULATORY_CARE_PROVIDER_SITE_OTHER): Payer: BC Managed Care – PPO | Admitting: Certified Nurse Midwife

## 2020-09-08 DIAGNOSIS — O2610 Low weight gain in pregnancy, unspecified trimester: Secondary | ICD-10-CM | POA: Insufficient documentation

## 2020-09-08 DIAGNOSIS — O283 Abnormal ultrasonic finding on antenatal screening of mother: Secondary | ICD-10-CM

## 2020-09-08 DIAGNOSIS — Z3400 Encounter for supervision of normal first pregnancy, unspecified trimester: Secondary | ICD-10-CM

## 2020-09-08 DIAGNOSIS — O2612 Low weight gain in pregnancy, second trimester: Secondary | ICD-10-CM

## 2020-09-08 NOTE — Progress Notes (Signed)
Subjective:  Karen Bender is a 30 y.o. G1P0000 at 12w2dbeing seen today for ongoing prenatal care.  She is currently monitored for the following issues for this low-risk pregnancy and has Supervision of normal first pregnancy and Poor weight gain of pregnancy on their problem list.  Patient reports no complaints.  Contractions: Not present. Vag. Bleeding: None.  Movement: Present. Denies leaking of fluid.   The following portions of the patient's history were reviewed and updated as appropriate: allergies, current medications, past family history, past medical history, past social history, past surgical history and problem list. Problem list updated.  Objective:   Vitals:   09/08/20 0925  BP: 110/69  Pulse: 95  Weight: 106 lb (48.1 kg)    Fetal Status: Fetal Heart Rate (bpm): 141 Fundal Height: 25 cm Movement: Present  Presentation: Undeterminable  General:  Alert, oriented and cooperative. Patient is in no acute distress.  Skin: Skin is warm and dry. No rash noted.   Cardiovascular: Normal heart rate noted  Respiratory: Normal respiratory effort, no problems with respiration noted  Abdomen: Soft, gravid, appropriate for gestational age. Pain/Pressure: Absent     Pelvic: Vag. Bleeding: None Vag D/C Character: Thin   Cervical exam deferred        Extremities: Normal range of motion.  Edema: None  Mental Status: Normal mood and affect. Normal behavior. Normal judgment and thought content.   Urinalysis:      Assessment and Plan:  Pregnancy: G1P0000 at 243w2d1. Low weight gain during pregnancy in second trimester - TWG 6 lbs - states she is eating well and has no trouble getting food - EFW on USKoreat 24w 16%ile>f/u scheduled  2. Supervision of normal first pregnancy, antepartum - [place] next [place]  Preterm labor symptoms and general obstetric precautions including but not limited to vaginal bleeding,  contractions, leaking of fluid and fetal movement were reviewed in detail with the patient. Please refer to After Visit Summary for other counseling recommendations.  Return in about 2 weeks (around 09/22/2020).   BhJulianne HandlerCNM

## 2020-09-15 ENCOUNTER — Other Ambulatory Visit: Payer: Self-pay

## 2020-09-15 ENCOUNTER — Emergency Department
Admission: EM | Admit: 2020-09-15 | Discharge: 2020-09-15 | Disposition: A | Discharge status: Other Acute Inpatient Hospital | Payer: BC Managed Care – PPO | Source: Home / Self Care

## 2020-09-15 NOTE — ED Notes (Signed)
Patient came in today stating that she is [redacted] weeks pregnant and she is leaking "clear/yellowish vaginal discharge" x 1 day.  Advised patient that she would need to be seen over at [facility] and [facility] next to [facility] ED.  They will be able to assess her there.  Patient voices understanding will go there for treatment.

## 2020-09-21 ENCOUNTER — Ambulatory Visit: Payer: BC Managed Care – PPO | Admitting: *Deleted

## 2020-09-21 ENCOUNTER — Other Ambulatory Visit: Payer: Self-pay

## 2020-09-21 ENCOUNTER — Encounter: Payer: Self-pay | Admitting: *Deleted

## 2020-09-21 ENCOUNTER — Ambulatory Visit: Payer: BC Managed Care – PPO | Attending: Obstetrics and Gynecology

## 2020-09-21 VITALS — BP 112/71 | HR 100

## 2020-09-21 DIAGNOSIS — O2613 Low weight gain in pregnancy, third trimester: Secondary | ICD-10-CM | POA: Insufficient documentation

## 2020-09-21 DIAGNOSIS — Z362 Encounter for other antenatal screening follow-up: Secondary | ICD-10-CM | POA: Diagnosis not present

## 2020-09-21 DIAGNOSIS — O358XX0 Maternal care for other (suspected) fetal abnormality and damage, not applicable or unspecified: Secondary | ICD-10-CM | POA: Diagnosis not present

## 2020-09-21 DIAGNOSIS — O283 Abnormal ultrasonic finding on antenatal screening of mother: Secondary | ICD-10-CM | POA: Diagnosis present

## 2020-09-21 DIAGNOSIS — O35EXX0 Maternal care for other (suspected) fetal abnormality and damage, fetal genitourinary anomalies, not applicable or unspecified: Secondary | ICD-10-CM

## 2020-09-21 DIAGNOSIS — Z3A28 28 weeks gestation of pregnancy: Secondary | ICD-10-CM | POA: Diagnosis not present

## 2020-09-22 ENCOUNTER — Ambulatory Visit (INDEPENDENT_AMBULATORY_CARE_PROVIDER_SITE_OTHER): Payer: BC Managed Care – PPO | Admitting: Certified Nurse Midwife

## 2020-09-22 ENCOUNTER — Other Ambulatory Visit (HOSPITAL_COMMUNITY)
Admission: RE | Admit: 2020-09-22 | Discharge: 2020-09-22 | Disposition: A | Discharge status: Home / Self Care | Payer: BC Managed Care – PPO | Source: Ambulatory Visit | Attending: Certified Nurse Midwife | Admitting: Certified Nurse Midwife

## 2020-09-22 VITALS — BP 96/65 | HR 88 | Wt 108.0 lb

## 2020-09-22 DIAGNOSIS — O283 Abnormal ultrasonic finding on antenatal screening of mother: Secondary | ICD-10-CM

## 2020-09-22 DIAGNOSIS — B373 Candidiasis of vulva and vagina: Secondary | ICD-10-CM | POA: Diagnosis present

## 2020-09-22 DIAGNOSIS — B3731 Acute candidiasis of vulva and vagina: Secondary | ICD-10-CM

## 2020-09-22 DIAGNOSIS — Z3400 Encounter for supervision of normal first pregnancy, unspecified trimester: Secondary | ICD-10-CM

## 2020-09-22 DIAGNOSIS — Z3A28 28 weeks gestation of pregnancy: Secondary | ICD-10-CM

## 2020-09-22 DIAGNOSIS — O2613 Low weight gain in pregnancy, third trimester: Secondary | ICD-10-CM

## 2020-09-22 MED ORDER — TERCONAZOLE 0.4 % VA CREA: 1.0000 | TOPICAL_CREAM | Freq: Every day | VAGINAL | 0 refills | Status: DC

## 2020-09-22 NOTE — Progress Notes (Signed)
Subjective:  Karen Bender is a 30 y.o. G1P0000 at 22w2dbeing seen today for ongoing prenatal care.  She is currently monitored for the following issues for this low-risk pregnancy and has Supervision of normal first pregnancy; Poor weight gain of pregnancy; and Abnormal fetal ultrasound on their problem list.  Patient reports  pale yellow discharge, watery at times, started 2 weeks ago . Denies itching or malodor. No gush of fluid. Contractions: Not present. Vag. Bleeding: None.  Movement: Present.   The following portions of the patient's history were reviewed and updated as appropriate: allergies, current medications, past family history, past medical history, past social history, past surgical history and problem list. Problem list updated.  Objective:   Vitals:   09/22/20 0854  BP: 96/65  Pulse: 88  Weight: 108 lb (49 kg)    Fetal Status: Fetal Heart Rate (bpm): 142 Fundal Height: 26 cm Movement: Present     General:  Alert, oriented and cooperative. Patient is in no acute distress.  Skin: Skin is warm and dry. No rash noted.   Cardiovascular: Normal heart rate noted  Respiratory: Normal respiratory effort, no problems with respiration noted  Abdomen: Soft, gravid, appropriate for gestational age. Pain/Pressure: Absent     Pelvic:  SSE: no pool, fern neg; yellow curdy discharge adherent to walls and thin liquidy discharge in vault Cervical exam performed        Extremities: Normal range of motion.  Edema: None  Mental Status: Normal mood and affect. Normal behavior. Normal judgment and thought content.   Urinalysis:      Assessment and Plan:  Pregnancy: G1P0000 at 231w2d1. Supervision of normal first pregnancy, antepartum - 2Hr GTT w/ 1 Hr Carpenter 75 g - HIV antibody (with reflex) - CBC - RPR  2. Abnormal fetal ultrasound - pyelectasis resolved and normal growth>21 %ile on USKoreaesterday  3. Low weight gain during pregnancy in third trimester - gained 2 pounds since  last visit - eating well, denies food insecurity - watch FH  4. [redacted] weeks gestation of pregnancy  5. Yeast vaginitis - Rx Terazol - Aptima swab  Preterm labor symptoms and general obstetric precautions including but not limited to vaginal bleeding, contractions, leaking of fluid and fetal movement were reviewed in detail with the patient. Please refer to After Visit Summary for other counseling recommendations.  Return in about 2 weeks (around 10/06/2020).   BhJulianne HandlerCNM

## 2020-09-25 LAB — CERVICOVAGINAL ANCILLARY ONLY
Bacterial Vaginitis (gardnerella): NEGATIVE
Candida Glabrata: NEGATIVE
Candida Vaginitis: POSITIVE — AB
Comment: NEGATIVE
Comment: NEGATIVE
Comment: NEGATIVE

## 2020-09-25 LAB — CBC
HCT: 35.8 % (ref 35.0–45.0)
Hemoglobin: 12 g/dL (ref 11.7–15.5)
MCH: 28.4 pg (ref 27.0–33.0)
MCHC: 33.5 g/dL (ref 32.0–36.0)
MCV: 84.8 fL (ref 80.0–100.0)
MPV: 10.6 fL (ref 7.5–12.5)
Platelets: 298 10*3/uL (ref 140–400)
RBC: 4.22 10*6/uL (ref 3.80–5.10)
RDW: 14.8 % (ref 11.0–15.0)
WBC: 10.2 10*3/uL (ref 3.8–10.8)

## 2020-09-25 LAB — HIV ANTIBODY (ROUTINE TESTING W REFLEX): HIV 1&2 Ab, 4th Generation: NONREACTIVE

## 2020-09-25 LAB — 2HR GTT W 1 HR, CARPENTER, 75 G
Glucose, 1 Hr, Gest: 100 mg/dL (ref 65–179)
Glucose, 2 Hr, Gest: 96 mg/dL (ref 65–152)
Glucose, Fasting, Gest: 78 mg/dL (ref 65–91)

## 2020-09-25 LAB — RPR: RPR Ser Ql: NONREACTIVE

## 2020-09-27 NOTE — Progress Notes (Signed)
This encounter was created in error - please disregard.

## 2020-10-06 ENCOUNTER — Ambulatory Visit (INDEPENDENT_AMBULATORY_CARE_PROVIDER_SITE_OTHER): Payer: BC Managed Care – PPO | Admitting: Obstetrics and Gynecology

## 2020-10-06 ENCOUNTER — Other Ambulatory Visit: Payer: Self-pay

## 2020-10-06 VITALS — BP 105/64 | HR 96 | Wt 111.0 lb

## 2020-10-06 DIAGNOSIS — O283 Abnormal ultrasonic finding on antenatal screening of mother: Secondary | ICD-10-CM

## 2020-10-06 DIAGNOSIS — O2613 Low weight gain in pregnancy, third trimester: Secondary | ICD-10-CM

## 2020-10-06 DIAGNOSIS — Z3400 Encounter for supervision of normal first pregnancy, unspecified trimester: Secondary | ICD-10-CM

## 2020-10-06 NOTE — Progress Notes (Signed)
   PRENATAL VISIT NOTE  Subjective:  Karen Bender is a 30 y.o. G1P0000 at [redacted]w[redacted]d being seen today for ongoing prenatal care.  She is currently monitored for the following issues for this low-risk pregnancy and has Supervision of normal first pregnancy; Poor weight gain of pregnancy; and Abnormal fetal ultrasound on their problem list.  Patient reports no complaints.  Contractions: Not present. Vag. Bleeding: None.  Movement: Present. Denies leaking of fluid.   The following portions of the patient's history were reviewed and updated as appropriate: allergies, current medications, past family history, past medical history, past social history, past surgical history and problem list.   Objective:   Vitals:   10/06/20 1018  BP: 105/64  Pulse: 96  Weight: 111 lb (50.3 kg)    Fetal Status: Fetal Heart Rate (bpm): 152 Fundal Height: 29 cm Movement: Present     General:  Alert, oriented and cooperative. Patient is in no acute distress.  Skin: Skin is warm and dry. No rash noted.   Cardiovascular: Normal heart rate noted  Respiratory: Normal respiratory effort, no problems with respiration noted  Abdomen: Soft, gravid, appropriate for gestational age.  Pain/Pressure: Absent     Pelvic: Cervical exam deferred        Extremities: Normal range of motion.  Edema: None  Mental Status: Normal mood and affect. Normal behavior. Normal judgment and thought content.   Assessment and Plan:  Pregnancy: G1P0000 at [redacted]w[redacted]d 1. Low weight gain during pregnancy in third trimester  Eating well Gaining weight.   2. Supervision of normal first pregnancy, antepartum  Glucose test was normal.   3. Abnormal fetal ultrasound  Last [place] was normal. Ptyalectasis resolved.   Preterm labor symptoms and general obstetric precautions including but not limited to vaginal bleeding, contractions, leaking of fluid and fetal movement were reviewed in detail with the patient. Please refer to After Visit Summary for  other counseling recommendations.   No follow-ups on file.  Future Appointments  Date Time Provider [place]  10/23/2020  9:30 AM Radene Gunning, MD [place], NP

## 2020-10-21 NOTE — Progress Notes (Signed)
   PRENATAL VISIT NOTE  Subjective:  Karen Bender is a 30 y.o. G1P0000 at [redacted]w[redacted]d being seen today for ongoing prenatal care.  She is currently monitored for the following issues for this low-risk pregnancy and has Supervision of normal first pregnancy; Poor weight gain of pregnancy; and Abnormal fetal ultrasound on their problem list.  Patient reports  vaginal pressure and irritation. She thinks she still has yeast infection - she did not do the full course of terconazole and would like to try it again .  Contractions: Not present. Vag. Bleeding: None.  Movement: Present. Denies leaking of fluid.   The following portions of the patient's history were reviewed and updated as appropriate: allergies, current medications, past family history, past medical history, past social history, past surgical history and problem list.   Objective:   Vitals:   10/23/20 1004  BP: 97/66  Pulse: 87  Weight: 114 lb (51.7 kg)    Fetal Status: Fetal Heart Rate (bpm): 135 Fundal Height: 31 cm Movement: Present     General:  Alert, oriented and cooperative. Patient is in no acute distress.  Skin: Skin is warm and dry. No rash noted.   Cardiovascular: Normal heart rate noted  Respiratory: Normal respiratory effort, no problems with respiration noted  Abdomen: Soft, gravid, appropriate for gestational age.  Pain/Pressure: Present     Pelvic: Cervical exam deferred        Extremities: Normal range of motion.  Edema: None  Mental Status: Normal mood and affect. Normal behavior. Normal judgment and thought content.   Assessment and Plan:  Pregnancy: G1P0000 at [redacted]w[redacted]d 1. Encounter for supervision of normal first pregnancy in third trimester - Offered tdap and flu - patient accepts flu shot today and will do tdap next time. - Refill sent of terconazole  2. Low weight gain during pregnancy in third trimester - TWG is 14 lbs, with 3 lb since last appt. FH is normal but we discussed limits of it and if discrepancy  from it then we will send for another [place]. No concern today.  - Last growth was 9/8 and was wnl. Korea repeated only if clinically indicated I.e. s<d  Preterm labor symptoms and general obstetric precautions including but not limited to vaginal bleeding, contractions, leaking of fluid and fetal movement were reviewed in detail with the patient. Please refer to After Visit Summary for other counseling recommendations.   Return in about 2 weeks (around 11/06/2020) for OB VISIT, MD or APP.  No future appointments.   Radene Gunning, MD

## 2020-10-23 ENCOUNTER — Ambulatory Visit (INDEPENDENT_AMBULATORY_CARE_PROVIDER_SITE_OTHER): Payer: BC Managed Care – PPO | Admitting: Obstetrics and Gynecology

## 2020-10-23 ENCOUNTER — Other Ambulatory Visit: Payer: Self-pay

## 2020-10-23 ENCOUNTER — Encounter: Payer: Self-pay | Admitting: Obstetrics and Gynecology

## 2020-10-23 VITALS — BP 97/66 | HR 87 | Wt 114.0 lb

## 2020-10-23 DIAGNOSIS — O2613 Low weight gain in pregnancy, third trimester: Secondary | ICD-10-CM

## 2020-10-23 DIAGNOSIS — Z23 Encounter for immunization: Secondary | ICD-10-CM | POA: Diagnosis not present

## 2020-10-23 DIAGNOSIS — Z3403 Encounter for supervision of normal first pregnancy, third trimester: Secondary | ICD-10-CM

## 2020-10-23 DIAGNOSIS — O23593 Infection of other part of genital tract in pregnancy, third trimester: Secondary | ICD-10-CM

## 2020-10-23 MED ORDER — TERCONAZOLE 0.4 % VA CREA: 1.0000 | TOPICAL_CREAM | Freq: Every day | VAGINAL | 0 refills | Status: DC

## 2020-10-23 NOTE — Addendum Note (Signed)
Addended by: Asencion Islam on: 10/23/2020 10:45 AM   Modules accepted: Orders

## 2020-11-09 ENCOUNTER — Other Ambulatory Visit: Payer: Self-pay

## 2020-11-09 ENCOUNTER — Ambulatory Visit (INDEPENDENT_AMBULATORY_CARE_PROVIDER_SITE_OTHER): Payer: BC Managed Care – PPO | Admitting: Obstetrics & Gynecology

## 2020-11-09 ENCOUNTER — Encounter: Payer: Self-pay | Admitting: Obstetrics & Gynecology

## 2020-11-09 VITALS — BP 115/76 | HR 102 | Wt 118.0 lb

## 2020-11-09 DIAGNOSIS — Z3403 Encounter for supervision of normal first pregnancy, third trimester: Secondary | ICD-10-CM

## 2020-11-09 DIAGNOSIS — Z23 Encounter for immunization: Secondary | ICD-10-CM

## 2020-11-09 DIAGNOSIS — O26843 Uterine size-date discrepancy, third trimester: Secondary | ICD-10-CM

## 2020-11-09 DIAGNOSIS — Z3A35 35 weeks gestation of pregnancy: Secondary | ICD-10-CM

## 2020-11-09 NOTE — Patient Instructions (Signed)
Return to office for any scheduled appointments. Call the office or go to the MAU at [facility] [place] if:  You begin to have strong, frequent contractions  Your water breaks.  Sometimes it is a big gush of fluid, sometimes it is just a trickle that keeps getting your panties wet or running down your legs  You have vaginal bleeding.  It is normal to have a small amount of spotting if your cervix was checked.   You do not feel your baby moving like normal.  If you do not, get something to eat and drink and lay down and focus on feeling your baby move.   If your baby is still not moving like normal, you should call the office or go to MAU.  Any other obstetric concerns.   

## 2020-11-09 NOTE — Progress Notes (Signed)
   PRENATAL VISIT NOTE  Subjective:  Karen Bender is a 30 y.o. G1P0000 at [redacted]w[redacted]d being seen today for ongoing prenatal care.  She is currently monitored for the following issues for this high-risk pregnancy and has Supervision of normal first pregnancy on their problem list.  Patient reports no complaints.  Contractions: Not present. Vag. Bleeding: None.  Movement: Present. Denies leaking of fluid.   The following portions of the patient's history were reviewed and updated as appropriate: allergies, current medications, past family history, past medical history, past social history, past surgical history and problem list.   Objective:   Vitals:   11/09/20 1325  BP: 115/76  Pulse: (!) 102  Weight: 118 lb (53.5 kg)    Fetal Status: Fetal Heart Rate (bpm): 140 Fundal Height: 31 cm Movement: Present     General:  Alert, oriented and cooperative. Patient is in no acute distress.  Skin: Skin is warm and dry. No rash noted.   Cardiovascular: Normal heart rate noted  Respiratory: Normal respiratory effort, no problems with respiration noted  Abdomen: Soft, gravid, appropriate for gestational age.  Pain/Pressure: Present     Pelvic: Cervical exam deferred        Extremities: Normal range of motion.  Edema: None  Mental Status: Normal mood and affect. Normal behavior. Normal judgment and thought content.   Assessment and Plan:  Pregnancy: G1P0000 at [redacted]w[redacted]d 1. Need for Tdap vaccination - Tdap vaccine greater than or equal to 7yo IM  2. Fundal height low for dates in third trimester Ultrasound ordered, will follow up results and manage accordingly. - [place] MFM OB FOLLOW UP; Future  3. [redacted] weeks gestation of pregnancy 4. Encounter for supervision of normal first pregnancy in third trimester Preterm labor symptoms and general obstetric precautions including but not limited to vaginal bleeding, contractions, leaking of fluid and fetal movement were reviewed in detail with the patient. Please  refer to After Visit Summary for other counseling recommendations.   Return in about 1 week (around 11/16/2020) for Pelvic cultures, OFFICE OB VISIT (MD or APP).  Future Appointments  Date Time Provider [place]  11/17/2020  9:30 AM Julianne Handler, CNM [facility]    Verita Schneiders, MD

## 2020-11-13 DIAGNOSIS — Z23 Encounter for immunization: Secondary | ICD-10-CM | POA: Diagnosis not present

## 2020-11-14 ENCOUNTER — Ambulatory Visit: Payer: BC Managed Care – PPO

## 2020-11-17 ENCOUNTER — Ambulatory Visit (INDEPENDENT_AMBULATORY_CARE_PROVIDER_SITE_OTHER): Payer: BC Managed Care – PPO | Admitting: Certified Nurse Midwife

## 2020-11-17 ENCOUNTER — Other Ambulatory Visit: Payer: Self-pay

## 2020-11-17 ENCOUNTER — Other Ambulatory Visit (HOSPITAL_COMMUNITY)
Admission: RE | Admit: 2020-11-17 | Discharge: 2020-11-17 | Disposition: A | Discharge status: Home / Self Care | Payer: BC Managed Care – PPO | Source: Ambulatory Visit | Attending: Certified Nurse Midwife | Admitting: Certified Nurse Midwife

## 2020-11-17 VITALS — BP 112/75 | HR 107 | Wt 118.0 lb

## 2020-11-17 DIAGNOSIS — Z3A36 36 weeks gestation of pregnancy: Secondary | ICD-10-CM

## 2020-11-17 DIAGNOSIS — O26843 Uterine size-date discrepancy, third trimester: Secondary | ICD-10-CM

## 2020-11-17 DIAGNOSIS — O26849 Uterine size-date discrepancy, unspecified trimester: Secondary | ICD-10-CM

## 2020-11-17 DIAGNOSIS — Z3403 Encounter for supervision of normal first pregnancy, third trimester: Secondary | ICD-10-CM | POA: Insufficient documentation

## 2020-11-17 DIAGNOSIS — N898 Other specified noninflammatory disorders of vagina: Secondary | ICD-10-CM

## 2020-11-17 DIAGNOSIS — O2613 Low weight gain in pregnancy, third trimester: Secondary | ICD-10-CM

## 2020-11-17 NOTE — Progress Notes (Signed)
Pt thinks she has a yeast infection

## 2020-11-17 NOTE — Progress Notes (Addendum)
Subjective:  Karen Bender is a 30 y.o. G1P0000 at [redacted]w[redacted]d being seen today for ongoing prenatal care.  She is currently monitored for the following issues for this low-risk pregnancy and has Supervision of normal first pregnancy on their problem list.  Patient reports  vaginal irritation . Just finished Terazol for yeast, last dose last night but missed a few doses. Denies malodor. Contractions: Irritability. Vag. Bleeding: None.  Movement: Present. Denies leaking of fluid.   The following portions of the patient's history were reviewed and updated as appropriate: allergies, current medications, past family history, past medical history, past social history, past surgical history and problem list. Problem list updated.  Objective:   Vitals:   11/17/20 0940  BP: 112/75  Pulse: (!) 107  Weight: 118 lb (53.5 kg)    Fetal Status: Fetal Heart Rate (bpm): 143 Fundal Height: 32 cm Movement: Present     General:  Alert, oriented and cooperative. Patient is in no acute distress.  Skin: Skin is warm and dry. No rash noted.   Cardiovascular: Normal heart rate noted  Respiratory: Normal respiratory effort, no problems with respiration noted  Abdomen: Soft, gravid, appropriate for gestational age. Pain/Pressure: Present     Pelvic: Vag. Bleeding: None Vag D/C Character: Thick, white; ?cream present Cervical exam performed        Extremities: Normal range of motion.  Edema: None  Mental Status: Normal mood and affect. Normal behavior. Normal judgment and thought content.   Urinalysis:      Assessment and Plan:  Pregnancy: G1P0000 at [redacted]w[redacted]d  1. [redacted] weeks gestation of pregnancy  2. Encounter for supervision of normal first pregnancy in third trimester - Culture, beta strep (group b only) - Cervicovaginal ancillary only( [place])  3. Vaginal irritation - Aptima - will retreat for yeast since missed doses - Rx Diflucan  4. S<D - MFM [place] scheduled in 3 days  5. Low weight gain - eating  well, good appetite  Preterm labor symptoms and general obstetric precautions including but not limited to vaginal bleeding, contractions, leaking of fluid and fetal movement were reviewed in detail with the patient. Please refer to After Visit Summary for other counseling recommendations.  Return in about 2 weeks (around 12/01/2020).   Julianne Handler, CNM

## 2020-11-18 MED ORDER — FLUCONAZOLE 150 MG PO TABS: 150.0000 mg | ORAL_TABLET | Freq: Once | ORAL | 0 refills | Status: AC

## 2020-11-18 NOTE — Addendum Note (Signed)
Addended by: Julianne Handler E on: 11/18/2020 08:35 AM   Modules accepted: Orders

## 2020-11-20 ENCOUNTER — Other Ambulatory Visit: Payer: Self-pay

## 2020-11-20 ENCOUNTER — Ambulatory Visit: Payer: BC Managed Care – PPO | Admitting: *Deleted

## 2020-11-20 ENCOUNTER — Encounter: Payer: Self-pay | Admitting: Certified Nurse Midwife

## 2020-11-20 ENCOUNTER — Ambulatory Visit: Payer: BC Managed Care – PPO | Attending: Obstetrics & Gynecology

## 2020-11-20 ENCOUNTER — Encounter: Payer: Self-pay | Admitting: *Deleted

## 2020-11-20 VITALS — BP 124/80 | HR 99

## 2020-11-20 DIAGNOSIS — Z3403 Encounter for supervision of normal first pregnancy, third trimester: Secondary | ICD-10-CM | POA: Diagnosis present

## 2020-11-20 DIAGNOSIS — Z3A35 35 weeks gestation of pregnancy: Secondary | ICD-10-CM | POA: Diagnosis present

## 2020-11-20 DIAGNOSIS — O26843 Uterine size-date discrepancy, third trimester: Secondary | ICD-10-CM | POA: Diagnosis present

## 2020-11-20 DIAGNOSIS — O99820 Streptococcus B carrier state complicating pregnancy: Secondary | ICD-10-CM | POA: Insufficient documentation

## 2020-11-20 LAB — CULTURE, BETA STREP (GROUP B ONLY)
MICRO NUMBER:: 12595680
SPECIMEN QUALITY:: ADEQUATE

## 2020-11-20 LAB — CERVICOVAGINAL ANCILLARY ONLY
Bacterial Vaginitis (gardnerella): NEGATIVE
Candida Glabrata: NEGATIVE
Candida Vaginitis: POSITIVE — AB
Chlamydia: NEGATIVE
Comment: NEGATIVE
Comment: NEGATIVE
Comment: NEGATIVE
Comment: NEGATIVE
Comment: NORMAL
Neisseria Gonorrhea: NEGATIVE

## 2020-11-27 ENCOUNTER — Ambulatory Visit (INDEPENDENT_AMBULATORY_CARE_PROVIDER_SITE_OTHER): Payer: BC Managed Care – PPO | Admitting: Obstetrics & Gynecology

## 2020-11-27 ENCOUNTER — Other Ambulatory Visit: Payer: Self-pay

## 2020-11-27 VITALS — BP 118/75 | HR 90 | Wt 118.0 lb

## 2020-11-27 DIAGNOSIS — O26843 Uterine size-date discrepancy, third trimester: Secondary | ICD-10-CM

## 2020-11-27 NOTE — Progress Notes (Signed)
   PRENATAL VISIT NOTE  Subjective:  Karen Bender is a 30 y.o. G1P0000 at [redacted]w[redacted]d being seen today for ongoing prenatal care.  She is currently monitored for the following issues for this high-risk pregnancy and has Supervision of normal first pregnancy; Uterine size date discrepancy; and GBS (group B Streptococcus carrier), +RV culture, currently pregnant on their problem list.  Patient reports no complaints.  Contractions: Not present. Vag. Bleeding: None.  Movement: Present. Denies leaking of fluid.   The following portions of the patient's history were reviewed and updated as appropriate: allergies, current medications, past family history, past medical history, past social history, past surgical history and problem list.   Objective:   Vitals:   11/27/20 1327  BP: 118/75  Pulse: 90  Weight: 118 lb (53.5 kg)    Fetal Status: Fetal Heart Rate (bpm): 145   Movement: Present     General:  Alert, oriented and cooperative. Patient is in no acute distress.  Skin: Skin is warm and dry. No rash noted.   Cardiovascular: Normal heart rate noted  Respiratory: Normal respiratory effort, no problems with respiration noted  Abdomen: Soft, gravid, appropriate for gestational age.  Pain/Pressure: Absent     Pelvic: Cervical exam deferred        Extremities: Normal range of motion.  Edema: None  Mental Status: Normal mood and affect. Normal behavior. Normal judgment and thought content.   Assessment and Plan:  Pregnancy: G1P0000 at [redacted]w[redacted]d   S<D--fundal height 33 cm--grew one cm in one week  Growth is 16%  Pt would like to not be induced and that elective induction would reduce the chance of IUFD while not increasing her cesarean section rates.  She would like to stay pregnant and monitor fundal heights.  She understands that ultrasound is more sensitive than fundal heights.  Patient agrees to do kick counts to ensure fetal wellbeing.  She understands to go to the MAU if there is decreased fetal  movement outside office hours and to come to our office if it is during office hours for an NST and evaluation.     Term labor symptoms and general obstetric precautions including but not limited to vaginal bleeding, contractions, leaking of fluid and fetal movement were reviewed in detail with the patient. Please refer to After Visit Summary for other counseling recommendations.   No follow-ups on file.  Future Appointments  Date Time Provider [facility]  12/04/2020  1:50 PM Guss Bunde, MD [facility]    Silas Sacramento, MD

## 2020-12-04 ENCOUNTER — Other Ambulatory Visit (HOSPITAL_COMMUNITY)
Admission: RE | Admit: 2020-12-04 | Discharge: 2020-12-04 | Disposition: A | Discharge status: Home / Self Care | Payer: BC Managed Care – PPO | Source: Ambulatory Visit | Attending: Obstetrics & Gynecology | Admitting: Obstetrics & Gynecology

## 2020-12-04 ENCOUNTER — Ambulatory Visit (INDEPENDENT_AMBULATORY_CARE_PROVIDER_SITE_OTHER): Payer: BC Managed Care – PPO | Admitting: Obstetrics & Gynecology

## 2020-12-04 ENCOUNTER — Other Ambulatory Visit: Payer: Self-pay

## 2020-12-04 VITALS — BP 107/69 | HR 100 | Wt 119.0 lb

## 2020-12-04 DIAGNOSIS — O26893 Other specified pregnancy related conditions, third trimester: Secondary | ICD-10-CM | POA: Diagnosis present

## 2020-12-04 DIAGNOSIS — N898 Other specified noninflammatory disorders of vagina: Secondary | ICD-10-CM

## 2020-12-04 DIAGNOSIS — Z3403 Encounter for supervision of normal first pregnancy, third trimester: Secondary | ICD-10-CM

## 2020-12-04 NOTE — Progress Notes (Deleted)
error 

## 2020-12-04 NOTE — Progress Notes (Signed)
Patient ID: Karen Bender, female   DOB: 1990-11-28, 30 y.o.   MRN: [id number]    PRENATAL VISIT NOTE  Subjective:  Karen Bender is a 30 y.o. G1P0000 at [redacted]w[redacted]d being seen today for ongoing prenatal care.  She is currently monitored for the following issues for this low-risk pregnancy and has Supervision of normal first pregnancy; Uterine size date discrepancy; and GBS (group B Streptococcus carrier), +RV culture, currently pregnant on their problem list.  Patient reports no complaints.  Contractions: Not present. Vag. Bleeding: None.  Movement: Present. Denies leaking of fluid.   The following portions of the patient's history were reviewed and updated as appropriate: allergies, current medications, past family history, past medical history, past social history, past surgical history and problem list.   Objective:   Vitals:   12/04/20 1348  BP: 107/69  Pulse: 100  Weight: 119 lb (54 kg)    Fetal Status: Fetal Heart Rate (bpm): 135   Movement: Present     General:  Alert, oriented and cooperative. Patient is in no acute distress.  Skin: Skin is warm and dry. No rash noted.   Cardiovascular: Normal heart rate noted  Respiratory: Normal respiratory effort, no problems with respiration noted  Abdomen: Soft, gravid, appropriate for gestational age.  Pain/Pressure: Absent     Pelvic: Cervical exam performed in the presence of a chaperone      1/60/-3/posterior with tone  Extremities: Normal range of motion.  Edema: None  Mental Status: Normal mood and affect. Normal behavior. Normal judgment and thought content.   Assessment and Plan:  Pregnancy: G1P0000 at [redacted]w[redacted]d 1. Encounter for supervision of normal first pregnancy in third trimester Patient is amendable to induction next Monday.  Patient will do kick counts twice a day.  She reports excellent fetal movement.  He understands the baby is small but not IUGR.  If fetal movement changes, she will call the office or go to the emergency  room.  2. Vaginal discharge during pregnancy in third trimester - Cervicovaginal ancillary only( [place])  Term labor symptoms and general obstetric precautions including but not limited to vaginal bleeding, contractions, leaking of fluid and fetal movement were reviewed in detail with the patient. Please refer to After Visit Summary for other counseling recommendations.   No future appointments.  Karen Sacramento, MD

## 2020-12-05 LAB — CERVICOVAGINAL ANCILLARY ONLY
Bacterial Vaginitis (gardnerella): NEGATIVE
Candida Glabrata: NEGATIVE
Candida Vaginitis: NEGATIVE
Comment: NEGATIVE
Comment: NEGATIVE
Comment: NEGATIVE

## 2020-12-06 ENCOUNTER — Telehealth: Payer: Self-pay | Admitting: *Deleted

## 2020-12-06 NOTE — Telephone Encounter (Signed)
Pt called wanting to know if she could go to the hospital to be make her baby was ok.  She is scheduled for IOL on Monday.  She was seen in the office  by Dr Gala Romney 11/21 and a cervical check was performed.  Pt was 1 cm dilated.  Pt states that she did she her mucus blood yesterday.  I told her that can is common and the cervix is thinning which allowed for the mucus plug to come out.  She states that the baby is moving fine and she denies any contractions.  I told her that going to the hospital just to see if she was any further dilated was not recommended unless she was having contractions or her water had broke.  I explained that going to the hospital increases her exposure to Covid and flu virus.  She just states that she is anxious and her sister and law told her just go and get checked out.  I told her I couldn't tell her not to go, that was her decision.  She was also concerned the Dr Ilda Basset being a female MD was the attending.  I let her know that the female midwives were the ones helping with her delivery and he would only be involved if they needed him or she had to have an emergency c-section.  She stated that she understood.

## 2020-12-09 ENCOUNTER — Other Ambulatory Visit: Payer: Self-pay | Admitting: Advanced Practice Midwife

## 2020-12-11 ENCOUNTER — Inpatient Hospital Stay (HOSPITAL_COMMUNITY)
Admission: AD | Admit: 2020-12-11 | Discharge: 2020-12-13 | DRG: 807 | Disposition: A | Discharge status: Home / Self Care | Payer: BC Managed Care – PPO | Attending: Obstetrics and Gynecology | Admitting: Obstetrics and Gynecology

## 2020-12-11 ENCOUNTER — Inpatient Hospital Stay (HOSPITAL_COMMUNITY): Payer: BC Managed Care – PPO

## 2020-12-11 ENCOUNTER — Inpatient Hospital Stay (HOSPITAL_COMMUNITY)
Admission: AD | Admit: 2020-12-11 | Payer: BC Managed Care – PPO | Source: Home / Self Care | Admitting: Obstetrics & Gynecology

## 2020-12-11 ENCOUNTER — Inpatient Hospital Stay (HOSPITAL_COMMUNITY): Payer: BC Managed Care – PPO | Admitting: Anesthesiology

## 2020-12-11 ENCOUNTER — Encounter (HOSPITAL_COMMUNITY): Payer: Self-pay | Admitting: Family Medicine

## 2020-12-11 DIAGNOSIS — O99824 Streptococcus B carrier state complicating childbirth: Secondary | ICD-10-CM | POA: Diagnosis present

## 2020-12-11 DIAGNOSIS — O26843 Uterine size-date discrepancy, third trimester: Secondary | ICD-10-CM

## 2020-12-11 DIAGNOSIS — Z3400 Encounter for supervision of normal first pregnancy, unspecified trimester: Secondary | ICD-10-CM

## 2020-12-11 DIAGNOSIS — O26893 Other specified pregnancy related conditions, third trimester: Secondary | ICD-10-CM | POA: Diagnosis present

## 2020-12-11 DIAGNOSIS — Z3403 Encounter for supervision of normal first pregnancy, third trimester: Secondary | ICD-10-CM

## 2020-12-11 DIAGNOSIS — Z20822 Contact with and (suspected) exposure to covid-19: Secondary | ICD-10-CM | POA: Diagnosis present

## 2020-12-11 DIAGNOSIS — Z3A39 39 weeks gestation of pregnancy: Secondary | ICD-10-CM

## 2020-12-11 DIAGNOSIS — O99820 Streptococcus B carrier state complicating pregnancy: Secondary | ICD-10-CM

## 2020-12-11 DIAGNOSIS — O365930 Maternal care for other known or suspected poor fetal growth, third trimester, not applicable or unspecified: Principal | ICD-10-CM | POA: Diagnosis present

## 2020-12-11 LAB — OB RESULTS CONSOLE GBS: GBS: POSITIVE

## 2020-12-11 LAB — CBC
HCT: 40.8 % (ref 36.0–46.0)
Hemoglobin: 13.4 g/dL (ref 12.0–15.0)
MCH: 27.6 pg (ref 26.0–34.0)
MCHC: 32.8 g/dL (ref 30.0–36.0)
MCV: 84.1 fL (ref 80.0–100.0)
Platelets: 391 10*3/uL (ref 150–400)
RBC: 4.85 MIL/uL (ref 3.87–5.11)
RDW: 14.3 % (ref 11.5–15.5)
WBC: 10.3 10*3/uL (ref 4.0–10.5)
nRBC: 0 % (ref 0.0–0.2)

## 2020-12-11 LAB — OB RESULTS CONSOLE GC/CHLAMYDIA: Gonorrhea: NEGATIVE

## 2020-12-11 LAB — RESP PANEL BY RT-PCR (FLU A&B, COVID) ARPGX2
Influenza A by PCR: NEGATIVE
Influenza B by PCR: NEGATIVE
SARS Coronavirus 2 by RT PCR: NEGATIVE

## 2020-12-11 LAB — TYPE AND SCREEN
ABO/RH(D): A POS
Antibody Screen: NEGATIVE

## 2020-12-11 MED ORDER — MISOPROSTOL 50MCG HALF TABLET
ORAL_TABLET | ORAL | Status: AC
  Filled 2020-12-11: qty 1

## 2020-12-11 MED ORDER — SODIUM BICARBONATE 8.4 % IV SOLN
INTRAVENOUS | Status: DC | PRN
  Administered 2020-12-11: 5 mL via EPIDURAL

## 2020-12-11 MED ORDER — EPHEDRINE 5 MG/ML INJ: 10.0000 mg | INTRAVENOUS | Status: DC | PRN

## 2020-12-11 MED ORDER — PENICILLIN G POT IN DEXTROSE 60000 UNIT/ML IV SOLN
3.0000 10*6.[IU] | INTRAVENOUS | Status: DC
  Administered 2020-12-11 (×3): 3 10*6.[IU] via INTRAVENOUS
  Filled 2020-12-11 (×3): qty 50

## 2020-12-11 MED ORDER — FENTANYL CITRATE (PF) 100 MCG/2ML IJ SOLN
100.0000 ug | INTRAMUSCULAR | Status: DC | PRN
  Administered 2020-12-11: 15:00:00 100 ug via INTRAVENOUS
  Filled 2020-12-11: qty 2

## 2020-12-11 MED ORDER — FENTANYL-BUPIVACAINE-NACL 0.5-0.125-0.9 MG/250ML-% EP SOLN
EPIDURAL | Status: AC
  Filled 2020-12-11: qty 250

## 2020-12-11 MED ORDER — LACTATED RINGERS IV SOLN: INTRAVENOUS | Status: DC

## 2020-12-11 MED ORDER — TERBUTALINE SULFATE 1 MG/ML IJ SOLN
0.2500 mg | Freq: Once | INTRAMUSCULAR | Status: AC | PRN
  Administered 2020-12-11: 19:00:00 0.25 mg via SUBCUTANEOUS

## 2020-12-11 MED ORDER — OXYCODONE-ACETAMINOPHEN 5-325 MG PO TABS: 1.0000 | ORAL_TABLET | ORAL | Status: DC | PRN

## 2020-12-11 MED ORDER — SOD CITRATE-CITRIC ACID 500-334 MG/5ML PO SOLN: 30.0000 mL | ORAL | Status: DC | PRN

## 2020-12-11 MED ORDER — LACTATED RINGERS IV SOLN: 500.0000 mL | Freq: Once | INTRAVENOUS | Status: DC

## 2020-12-11 MED ORDER — DIPHENHYDRAMINE HCL 50 MG/ML IJ SOLN: 12.5000 mg | INTRAMUSCULAR | Status: DC | PRN

## 2020-12-11 MED ORDER — FENTANYL-BUPIVACAINE-NACL 0.5-0.125-0.9 MG/250ML-% EP SOLN
12.0000 mL/h | EPIDURAL | Status: DC | PRN
  Administered 2020-12-11: 16:00:00 12 mL/h via EPIDURAL

## 2020-12-11 MED ORDER — ONDANSETRON HCL 4 MG/2ML IJ SOLN: 4.0000 mg | Freq: Four times a day (QID) | INTRAMUSCULAR | Status: DC | PRN

## 2020-12-11 MED ORDER — ACETAMINOPHEN 325 MG PO TABS: 650.0000 mg | ORAL_TABLET | ORAL | Status: DC | PRN

## 2020-12-11 MED ORDER — MISOPROSTOL 50MCG HALF TABLET
50.0000 ug | ORAL_TABLET | ORAL | Status: DC | PRN
  Administered 2020-12-11: 11:00:00 50 ug via BUCCAL

## 2020-12-11 MED ORDER — PHENYLEPHRINE 40 MCG/ML (10ML) SYRINGE FOR IV PUSH (FOR BLOOD PRESSURE SUPPORT): 80.0000 ug | PREFILLED_SYRINGE | INTRAVENOUS | Status: DC | PRN

## 2020-12-11 MED ORDER — LACTATED RINGERS IV SOLN: 500.0000 mL | INTRAVENOUS | Status: DC | PRN

## 2020-12-11 MED ORDER — OXYTOCIN-SODIUM CHLORIDE 30-0.9 UT/500ML-% IV SOLN
2.5000 [IU]/h | INTRAVENOUS | Status: DC
  Administered 2020-12-12: 2.5 [IU]/h via INTRAVENOUS
  Filled 2020-12-11: qty 500

## 2020-12-11 MED ORDER — OXYTOCIN-SODIUM CHLORIDE 30-0.9 UT/500ML-% IV SOLN: 1.0000 m[IU]/min | INTRAVENOUS | Status: DC

## 2020-12-11 MED ORDER — SODIUM CHLORIDE 0.9 % IV SOLN
5.0000 10*6.[IU] | Freq: Once | INTRAVENOUS | Status: AC
  Administered 2020-12-11: 11:00:00 5 10*6.[IU] via INTRAVENOUS
  Filled 2020-12-11: qty 5

## 2020-12-11 MED ORDER — TERBUTALINE SULFATE 1 MG/ML IJ SOLN
0.2500 mg | Freq: Once | INTRAMUSCULAR | Status: DC | PRN
  Filled 2020-12-11: qty 1

## 2020-12-11 MED ORDER — TERBUTALINE SULFATE 1 MG/ML IJ SOLN: 0.2500 mg | Freq: Once | INTRAMUSCULAR | Status: DC | PRN

## 2020-12-11 MED ORDER — MISOPROSTOL 25 MCG QUARTER TABLET: 25.0000 ug | ORAL_TABLET | ORAL | Status: DC | PRN

## 2020-12-11 MED ORDER — LIDOCAINE HCL (PF) 1 % IJ SOLN
30.0000 mL | INTRAMUSCULAR | Status: AC | PRN
  Administered 2020-12-12: 30 mL via SUBCUTANEOUS
  Filled 2020-12-11: qty 30

## 2020-12-11 MED ORDER — OXYTOCIN BOLUS FROM INFUSION
333.0000 mL | Freq: Once | INTRAVENOUS | Status: AC
  Administered 2020-12-12: 333 mL via INTRAVENOUS

## 2020-12-11 MED ORDER — OXYCODONE-ACETAMINOPHEN 5-325 MG PO TABS: 2.0000 | ORAL_TABLET | ORAL | Status: DC | PRN

## 2020-12-11 MED ORDER — OXYTOCIN-SODIUM CHLORIDE 30-0.9 UT/500ML-% IV SOLN
1.0000 m[IU]/min | INTRAVENOUS | Status: DC
  Administered 2020-12-11: 18:00:00 2 m[IU]/min via INTRAVENOUS

## 2020-12-11 NOTE — Anesthesia Procedure Notes (Signed)
Epidural Patient location during procedure: OB Start time: 12/11/2020 3:57 PM End time: 12/11/2020 4:12 PM  Staffing Anesthesiologist: Duane Boston, MD Performed: anesthesiologist   Preanesthetic Checklist Completed: patient identified, IV checked, site marked, risks and benefits discussed, monitors and equipment checked, pre-op evaluation and timeout performed  Epidural Patient position: sitting Prep: DuraPrep Patient monitoring: heart rate, cardiac monitor, continuous pulse ox and blood pressure Approach: midline Location: L2-L3 Injection technique: LOR saline  Needle:  Needle type: Tuohy  Needle gauge: 17 G Needle length: 9 cm Needle insertion depth: 5 cm Catheter size: 20 Guage Catheter at skin depth: 10 cm Test dose: negative and Other  Assessment Events: blood not aspirated, injection not painful, no injection resistance and negative IV test  Additional Notes Informed consent obtained prior to proceeding including risk of failure, 1% risk of PDPH, risk of minor discomfort and bruising.  Discussed rare but serious complications including epidural abscess, permanent nerve injury, epidural hematoma.  Discussed alternatives to epidural analgesia and patient desires to proceed.  Timeout performed pre-procedure verifying patient name, procedure, and platelet count.  Patient tolerated procedure well.

## 2020-12-11 NOTE — Progress Notes (Signed)
Patient Vitals for the past 4 hrs:  BP Temp Temp src Pulse Resp  12/11/20 2033 126/64 -- -- (!) 109 15  12/11/20 1929 116/64 -- -- (!) 108 16  12/11/20 1904 115/71 -- -- (!) 114 --  12/11/20 1903 -- -- -- -- 16  12/11/20 1834 129/85 (!) 97.5 F (36.4 C) Oral 87 18  12/11/20 1809 133/90 -- -- 88 18  12/11/20 1801 123/85 -- -- (!) 151 --  12/11/20 1731 (!) 119/95 -- -- 96 --   Comfortable w/epidural.  Ctx pattern has been anywhere from tachysystole (q 1 minutes) to coupling/tripling. Pt received terbutaline at one point, which spaced ctx out some.  Any time pitocin was attempted, even at 1 mu/min, FHR would have variables/late decels. IUPC placed, MVUs ~ 200, but ctx pattern is coupling.  Cx still 7/70/-1.  Dr. Damita Dunnings given update.  Will stay the current course, add peanut ball and position changes to facilitate rotation.

## 2020-12-11 NOTE — Anesthesia Preprocedure Evaluation (Signed)
Anesthesia Evaluation  Patient identified by MRN, date of birth, ID band Patient awake    Reviewed: Allergy & Precautions, H&P , NPO status , Patient's Chart, lab work & pertinent test results  Airway Mallampati: II  TM Distance: >3 FB Neck ROM: Full    Dental no notable dental hx. (+) Dental Advisory Given   Pulmonary neg pulmonary ROS,    Pulmonary exam normal        Cardiovascular negative cardio ROS Normal cardiovascular exam     Neuro/Psych negative neurological ROS  negative psych ROS   GI/Hepatic negative GI ROS, Neg liver ROS,   Endo/Other  negative endocrine ROS  Renal/GU negative Renal ROS  negative genitourinary   Musculoskeletal negative musculoskeletal ROS (+)   Abdominal   Peds negative pediatric ROS (+)  Hematology negative hematology ROS (+) anemia ,   Anesthesia Other Findings   Reproductive/Obstetrics negative OB ROS                             Anesthesia Physical Anesthesia Plan  ASA: 2  Anesthesia Plan: Epidural   Post-op Pain Management:    Induction:   PONV Risk Score and Plan:   Airway Management Planned: Natural Airway  Additional Equipment:   Intra-op Plan:   Post-operative Plan:   Informed Consent: I have reviewed the patients History and Physical, chart, labs and discussed the procedure including the risks, benefits and alternatives for the proposed anesthesia with the patient or authorized representative who has indicated his/her understanding and acceptance.     Dental advisory given  Plan Discussed with: Anesthesiologist  Anesthesia Plan Comments:         Anesthesia Quick Evaluation

## 2020-12-11 NOTE — H&P (Signed)
OBSTETRIC ADMISSION HISTORY AND PHYSICAL  Karen Bender is a 30 y.o. female G1P0000 with IUP at 51w5dby LMP presenting for IOL. She reports +FMs, No LOF, no VB, no blurry vision, headaches or peripheral edema, and RUQ pain.  She plans on breast feeding, declines birth control [place]. [facility] Dating: By LMP --->  Estimated Date of Delivery: 12/13/20  Sono:    _0 , CWD, normal anatomy, cephali c presentation, 2589g, 16% EFW   Prenatal History/Complications:  Vaginal yeast infection at 36 weeks s/p Diflucan. Size less than dating s/p MFM [place] 381w5das above. Low weight gain during second and third trimesters w/ TWG 6 pounds at 2655w2dnd TWG 14 pounds by 32w71w3dnormal fetal ultrasound at 24w showing pyelectasis, subsequently resolved with normal growth >21% at 28w. [place] care [place] at 22w180w1dst Medical History: Past Medical History:  Diagnosis Date   Anemia     Past Surgical History: Past Surgical History:  Procedure Laterality Date   NO PAST SURGERIES      Obstetrical History: OB History     Gravida  1   Para  0   Term  0   Preterm  0   AB  0   Living  0      SAB  0   IAB  0   Ectopic  0   Multiple  0   Live Births  0           Social History Social History   Socioeconomic History   Marital status: Married    Spouse name: Not on file   Number of children: Not on file   Years of education: Not on file   Highest education level: Not on file  Occupational History   Not on file  Tobacco Use   Smoking status: Never   Smokeless tobacco: Never  Vaping Use   Vaping Use: Never used  Substance and Sexual Activity   Alcohol use: No   Drug use: Never   Sexual activity: Yes    Birth control/protection: None  Other Topics Concern   Not on file  Social History Narrative   Not on file   Social Determinants of Health    Financial Resource Strain: Not on file  Food Insecurity: Not on file  Transportation Needs: Not on file  Physical Activity: Not on file  Stress: Not on file  Social Connections: Not on file    Family History: Family History  Problem Relation Age of Onset   Diabetes Mother    Hypertension Mother    Healthy Father     Allergies: No Known Allergies  Medications Prior to Admission  Medication Sig Dispense Refill Last Dose   ferrous sulfate 325 (65 FE) MG tablet Take by mouth.      Prenatal Vit-Fe Fumarate-FA (MULTIVITAMIN-PRENATAL) 27-0.8 MG TABS tablet Take 1 tablet by mouth daily at 12 noon.        Review of Systems   All systems reviewed and negative except as stated in HPI  Last menstrual period 03/08/2020. General appearance: alert, cooperative, and no distress Lungs: clear to auscultation bilaterally Heart: regular rate and rhythm Abdomen: soft, non-tender; bowel sounds normal Pelvic: FT/long/-2/vtx Extremities: Homans sign is negative, no sign of DVT DTR's 2+ Presentation: cephalic Fetal monitoringBaseline: 140 bpm, Variability: Good {> 6 bpm), Accelerations: Reactive, and Decelerations: Absent Uterine activityNone     Prenatal  labs:   Nursing Staff Provider  Office Location  [place] Dating  LMP  Language  English Anatomy US  Fetal renal pylectasis>resolved  Flu Vaccine  10/23/20 Genetic/Carrier Screen  NIPS:  nml AFP:    [place]:neg  TDaP Vaccine   11/09/20 Hgb A1C or  GTT Early  Third trimester: normal  COVID Vaccine  yes   LAB RESULTS   Rhogam  A + Blood Type A/RH(D) POSITIVE/-- (07/28 1534)   Baby Feeding Plan  breast Antibody NO ANTIBODIES DETECTED (07/28 1534)  Contraception  declines Rubella 15.90 (07/28 1534)  Circumcision  iT'S A GIRL RPR NON-REACTIVE (07/28 1534)   Pediatrician  [facility] HBsAg NON-REACTIVE (07/28 1534)   Support Person  [place] HCVAb Negative  Prenatal Classes  HIV NON-REACTIVE (07/28 1534)      BTL Consent  GBS   (For PCN allergy, check sensitivities)   VBAC Consent  Pap WNL 11/03/19       DME Rx _0  BP cuff _1  Weight Scale Waterbirth  _2  Class _3  Consent _4  CNM visit  PHQ9 & GAD7 Valu.Nieves  ] new OB [  ] 28 weeks  [  ] 36 weeks Induction  _5  Orders Entered _6 Foley Y/N    Prenatal Transfer Tool  Maternal Diabetes: No Genetic Screening: Normal Maternal Ultrasounds/Referrals: Normal Fetal Ultrasounds or other Referrals:  Referred to Materal Fetal Medicine  Maternal Substance Abuse:  No Significant Maternal Medications:  None Significant Maternal Lab Results: Group B Strep positive  Results for orders placed or performed during the hospital encounter of 12/11/20 (from the past 24 hour(s))  OB RESULT CONSOLE Group B Strep   Collection Time: 12/11/20 12:00 AM  Result Value Ref Range   GBS Positive     Patient Active Problem List   Diagnosis Date Noted   Small for gestational age 28/28/2022   GBS (group B Streptococcus carrier), +RV culture, currently pregnant 11/20/2020   Uterine size date discrepancy 11/17/2020   Supervision of normal first pregnancy 08/10/2020    Assessment/Plan:  Karen Bender is a 30 y.o. G1P0000 at 42w5dhere for elective IOL for small (not FGR) baby per MFM  #Labor:Cooks catheter inserted and inflated w/80cc H20 by CNM; buccal cytotec #Pain: Per request #FWB: Cat [place] [facility]  12/11/2020, 10:02 AM  I personally saw and evaluated the patient, performing the key elements of the service. I developed and verified the management plan that is described in the resident's/student's note, and I agree with the content with my edits above. VSS, HRR&R, Resp unlabored, Legs neg.  FNigel Berthold CNM 12/12/2020 7:38 AM

## 2020-12-11 NOTE — Progress Notes (Signed)
Patient Vitals for the past 4 hrs:  BP Temp Temp src Pulse Resp  12/11/20 1731 (!) 119/95 -- -- 96 --  12/11/20 1701 117/79 -- -- 79 16  12/11/20 1631 121/83 -- -- 92 16  12/11/20 1628 125/85 -- -- 89 --  12/11/20 1626 126/88 -- -- 92 18  12/11/20 1620 118/87 -- -- -- --  12/11/20 1617 (!) 96/30 -- -- 98 --  12/11/20 1611 122/86 -- -- (!) 217 --  12/11/20 1610 129/88 -- -- 98 --  12/11/20 1604 (!) 144/101 -- -- 97 --  12/11/20 1434 117/81 97.9 F (36.6 C) Oral 92 18  12/11/20 1430 -- -- -- -- 18   Comfortalbe w/epidural.  Cooks came out around 1500, had SROM around 1700. Has had tachysystolic uterine ctx most of the afternoon, but not too strong  FHR has remained Cat 1.  Ctx pattern now irregular.  Cx 7/70/-1.  Will start pitocin to hopefully even out ctx pattern.

## 2020-12-12 ENCOUNTER — Encounter (HOSPITAL_COMMUNITY): Payer: Self-pay | Admitting: Family Medicine

## 2020-12-12 DIAGNOSIS — O99824 Streptococcus B carrier state complicating childbirth: Secondary | ICD-10-CM

## 2020-12-12 DIAGNOSIS — Z3A39 39 weeks gestation of pregnancy: Secondary | ICD-10-CM

## 2020-12-12 LAB — RPR: RPR Ser Ql: NONREACTIVE

## 2020-12-12 MED ORDER — DIPHENHYDRAMINE HCL 25 MG PO CAPS: 25.0000 mg | ORAL_CAPSULE | Freq: Four times a day (QID) | ORAL | Status: DC | PRN

## 2020-12-12 MED ORDER — PRENATAL MULTIVITAMIN CH
1.0000 | ORAL_TABLET | Freq: Every day | ORAL | Status: DC
  Administered 2020-12-12 – 2020-12-13 (×2): 1 via ORAL
  Filled 2020-12-12: qty 1

## 2020-12-12 MED ORDER — IBUPROFEN 600 MG PO TABS
ORAL_TABLET | ORAL | Status: AC
  Filled 2020-12-12: qty 1

## 2020-12-12 MED ORDER — SIMETHICONE 80 MG PO CHEW: 80.0000 mg | CHEWABLE_TABLET | ORAL | Status: DC | PRN

## 2020-12-12 MED ORDER — COCONUT OIL OIL: 1.0000 "application " | TOPICAL_OIL | Status: DC | PRN

## 2020-12-12 MED ORDER — BENZOCAINE-MENTHOL 20-0.5 % EX AERO
1.0000 "application " | INHALATION_SPRAY | CUTANEOUS | Status: DC | PRN
  Administered 2020-12-12: 1 via TOPICAL

## 2020-12-12 MED ORDER — ACETAMINOPHEN 325 MG PO TABS
650.0000 mg | ORAL_TABLET | ORAL | Status: DC | PRN
  Administered 2020-12-12: 650 mg via ORAL
  Filled 2020-12-12: qty 2

## 2020-12-12 MED ORDER — WITCH HAZEL-GLYCERIN EX PADS
1.0000 "application " | MEDICATED_PAD | CUTANEOUS | Status: DC | PRN
  Administered 2020-12-12: 1 via TOPICAL

## 2020-12-12 MED ORDER — BENZOCAINE-MENTHOL 20-0.5 % EX AERO
INHALATION_SPRAY | CUTANEOUS | Status: AC
  Filled 2020-12-12: qty 56

## 2020-12-12 MED ORDER — IBUPROFEN 600 MG PO TABS
600.0000 mg | ORAL_TABLET | Freq: Four times a day (QID) | ORAL | Status: DC
  Administered 2020-12-12 – 2020-12-13 (×7): 600 mg via ORAL
  Filled 2020-12-12 (×4): qty 1

## 2020-12-12 MED ORDER — TETANUS-DIPHTH-ACELL PERTUSSIS 5-2.5-18.5 LF-MCG/0.5 IM SUSY: 0.5000 mL | PREFILLED_SYRINGE | Freq: Once | INTRAMUSCULAR | Status: DC

## 2020-12-12 MED ORDER — ONDANSETRON HCL 4 MG/2ML IJ SOLN: 4.0000 mg | INTRAMUSCULAR | Status: DC | PRN

## 2020-12-12 MED ORDER — DIBUCAINE (PERIANAL) 1 % EX OINT: 1.0000 "application " | TOPICAL_OINTMENT | CUTANEOUS | Status: DC | PRN

## 2020-12-12 MED ORDER — SENNOSIDES-DOCUSATE SODIUM 8.6-50 MG PO TABS
2.0000 | ORAL_TABLET | Freq: Every day | ORAL | Status: DC
  Administered 2020-12-13: 2 via ORAL
  Filled 2020-12-12: qty 2

## 2020-12-12 MED ORDER — ONDANSETRON HCL 4 MG PO TABS: 4.0000 mg | ORAL_TABLET | ORAL | Status: DC | PRN

## 2020-12-12 NOTE — Discharge Summary (Signed)
Postpartum Discharge Summary    Patient Name: Karen Bender DOB: 1990/03/27 MRN: [id number]  Date of admission: 12/11/2020 Delivery date:12/12/2020  Delivering provider: Christin Fudge  Date of discharge: 12/13/2020  Admitting diagnosis: Small for gestational age [P44.10] Intrauterine pregnancy: [redacted]w[redacted]d    Secondary diagnosis:  Principal Problem:   Vaginal delivery Active Problems:   Supervision of normal first pregnancy   GBS (group B Streptococcus carrier), +RV culture, currently pregnant   Small for gestational age  Additional problems: None    Discharge diagnosis: Term Pregnancy Delivered                                              Post partum procedures: None Augmentation: Pitocin, Cytotec, and IP Foley Complications: None  Hospital course: Induction of Labor With Vaginal Delivery   30y.o. yo G1P0000 at 353w6das admitted to the hospital 12/11/2020 for induction of labor.  Indication for induction:  SGA .  Patient had an uncomplicated labor course as follows: Membrane Rupture Time/Date: 5:08 PM ,12/11/2020   Delivery Method:Vaginal, Spontaneous  Episiotomy: None  Lacerations:  2nd degree;Perineal  Details of delivery can be found in separate delivery note.  Patient had a routine postpartum course and is meeting all postpartum milestones. Patient is discharged home 12/13/20.  Newborn Data: Birth date:12/12/2020  Birth time:1:03 AM  Gender:Female  Living status:Living  Apgars:9 ,9  Weight:2866 g   Magnesium Sulfate received: No BMZ received: No Rhophylac: N/A MMR: N/A T-DaP: Given prenatally Flu: Yes Transfusion: No  Physical exam  Vitals:   12/12/20 1610 12/12/20 2207 12/13/20 0557 12/13/20 1435  BP: 118/76 117/77 108/82 (!) 125/95  Pulse: 76 89 94 93  Resp: _0 Temp: 97.8 F (36.6 C) 97.8 F (36.6 C) 98.2 F (36.8 C) 98.3 F (36.8 C)  TempSrc: Oral Oral  Oral  SpO2: 99% 99% 99% 98%  Weight:      Height:       General:  alert, cooperative, and no distress Lochia: appropriate Uterine Fundus: firm and below umbilicus DVT Evaluation: no LE edema or calf tenderness to palpation  Labs: Lab Results  Component Value Date   WBC 10.3 12/11/2020   HGB 13.4 12/11/2020   HCT 40.8 12/11/2020   MCV 84.1 12/11/2020   PLT 391 12/11/2020   CMP Latest Ref Rng & Units 08/01/2013  Glucose 70 - 99 mg/dL 107(H)  BUN 6 - 23 mg/dL 10  Creatinine 0.50 - 1.10 mg/dL 0.70  Sodium 137 - 147 mEq/L 140  Potassium 3.7 - 5.3 mEq/L 4.0  Chloride 96 - 112 mEq/L 102  CO2 19 - 32 mEq/L 25  Calcium 8.4 - 10.5 mg/dL 10.0   Edinburgh Score: Edinburgh Postnatal Depression Scale Screening Tool 12/13/2020  I have been able to laugh and see the funny side of things. 0  I have looked forward with enjoyment to things. 0  I have blamed myself unnecessarily when things went wrong. 0  I have been anxious or worried for no good reason. 0  I have felt scared or panicky for no good reason. 0  Things have been getting on top of me. 1  I have been so unhappy that I have had difficulty sleeping. 0  I have felt sad or miserable. 0  I have been so unhappy that I have been crying. 0  The thought of harming myself has occurred to me. 0  Edinburgh Postnatal Depression Scale Total 1     After visit meds:  Allergies as of 12/13/2020   No Known Allergies      Medication List     TAKE these medications    acetaminophen 500 MG tablet Commonly known as: TYLENOL Take 2 tablets (1,000 mg total) by mouth every 8 (eight) hours as needed (pain).   ferrous sulfate 325 (65 FE) MG tablet Take by mouth.   ibuprofen 600 MG tablet Commonly known as: ADVIL Take 1 tablet (600 mg total) by mouth every 6 (six) hours as needed (pain).   multivitamin-prenatal 27-0.8 MG Tabs tablet Take 1 tablet by mouth daily at 12 noon.               Durable Medical Equipment  (From admission, onward)           Start     Ordered   12/13/20 1552  For  home use only DME double electric breast pump  Once       Comments: ICD-10 code Z39.1   12/13/20 1552             Discharge home in stable condition Infant Feeding: Breast Infant Disposition:home with mother Discharge instruction: per After Visit Summary and Postpartum booklet. Activity: Advance as tolerated. Pelvic rest for 6 weeks.  Diet: routine diet Future Appointments: Future Appointments  Date Time Provider [place]  01/09/2021  1:30 PM Leftwich-Kirby, Kathie Dike, CNM [facility]   Follow up Visit: Please schedule this patient for a In person postpartum visit in 4 weeks with the following provider: Any provider. Additional Postpartum F/U: None Low risk pregnancy complicated by: N/A Delivery mode:  Vaginal, Spontaneous  Anticipated Birth Control:  None  12/13/2020 Genia Del, MD

## 2020-12-12 NOTE — Progress Notes (Signed)
Post Partum Day 0  Subjective: Doing well. No acute events overnight. Pain is controlled and bleeding is appropriate. She is eating and drinking. She has not voided or ambulated yet. She is breast feeding which is going okay. She is agreeable to lactation support. She has no other concerns at this time.  Objective: Blood pressure 108/72, pulse (!) 120, temperature 98.1 F (36.7 C), temperature source Oral, resp. rate 16, height 5\' 3"  (1.6 m), weight 54.4 kg, last menstrual period 03/08/2020, SpO2 100 %, unknown if currently breastfeeding.  Physical Exam:  General: alert, cooperative, and no distress Lochia: appropriate Uterine Fundus: firm DVT Evaluation: no edema or calf tenderness.  Recent Labs    12/11/20 1016  HGB 13.4  HCT 40.8    Assessment/Plan: Karen Bender is a 30 y.o. G1P1001 on PPD# 0 s/p SVD.  Progressing well. Meeting postpartum milestones. VSS. Continue routine postpartum care.  Feeding: breast Contraception: declines  Dispo: Anticipate discharge tomorrow   LOS: 1 day   Kristeen Miss, MD  12/12/2020, 7:57 AM

## 2020-12-12 NOTE — Lactation Note (Signed)
This note was copied from a baby's chart. Lactation Consultation Note  Patient Name: Karen Bender [id number] Date: 12/12/2020 Reason for consult: Initial assessment;Primapara;Term;1st time breastfeeding Age:29 hours   P1 mother whose infant is now 43 hours old.  This is a term baby at 39+6 weeks.  Mother's feeding preference is breast.  Baby was swaddled and asleep when I arrived.  Within minutes of my arrival she had two episodes of emesis: one small and one medium emesis.  Demonstrated how to hold her upright and burp firmly.  Emesis was frothy white.  Mother reported that baby has been spitting a little bit.  Reassured family and family member that this is typical in the first couple days of life.    Mother had no questions/concerns at this time related to breast feeding.  Reviewed feeding cues and breast feeding basics.  Mother able to express colostrum drops.  Demonstrated finger feeding and spoon feeding.  Suggested mother perform hand expression before/after feedings and to feed back any EBM she obtains. Suggested she call her RN/LC for latch assistance as needed.  Mom made aware of O/P services, breastfeeding support groups, community resources, and our phone # for post-discharge questions.  Mother has a support person present.  She has Multimedia programmer.  Informed mother on how she can call her insurance company to determine eligibility for a DEBP.  Mother appreciative.  RN updated.   Maternal Data Has patient been taught Hand Expression?: Yes Does the patient have breastfeeding experience prior to this delivery?: No  Feeding Mother's Current Feeding Choice: Breast Milk  LATCH Score                    Lactation Tools Discussed/Used    Interventions Interventions: Breast feeding basics reviewed;Education  Discharge Pump: Personal (Mother has insurance: informed her on how to obtain a DEBP) [facility]: No  Consult Status Consult Status: Follow-up Date:  12/13/20 Follow-up type: In-patient    Janalyn Higby R Zeda Gangwer 12/12/2020, 9:22 AM

## 2020-12-12 NOTE — Anesthesia Postprocedure Evaluation (Signed)
Anesthesia Post Note  Patient: Karen Bender  Procedure(s) Performed: AN AD HOC LABOR EPIDURAL     Patient location during evaluation: Mother Baby Anesthesia Type: Epidural Level of consciousness: awake Pain management: satisfactory to patient Vital Signs Assessment: post-procedure vital signs reviewed and stable Respiratory status: spontaneous breathing Cardiovascular status: stable Anesthetic complications: no   No notable events documented.  Last Vitals:  Vitals:   12/12/20 0315 12/12/20 0430  BP: 117/70 108/72  Pulse: (!) 108 (!) 120  Resp: 18 16  Temp: 36.9 C 36.7 C  SpO2: 100% 100%    Last Pain:  Vitals:   12/12/20 0430  TempSrc: Oral  PainSc: 0-No pain   Pain Goal:                   [employer]

## 2020-12-13 LAB — SURGICAL PATHOLOGY

## 2020-12-13 MED ORDER — ACETAMINOPHEN 500 MG PO TABS
1000.0000 mg | ORAL_TABLET | Freq: Three times a day (TID) | ORAL | 0 refills | Status: DC | PRN
Start: 2020-12-13 — End: 2022-02-13

## 2020-12-13 MED ORDER — IBUPROFEN 600 MG PO TABS: 600.0000 mg | ORAL_TABLET | Freq: Four times a day (QID) | ORAL | 0 refills | Status: DC | PRN

## 2020-12-13 NOTE — Progress Notes (Signed)
Post Partum Day 1 Subjective: Doing well. No acute events overnight. Pain is controlled and bleeding is appropriate. She is eating, drinking, voiding, and ambulating without issue. She is working on breast feeding which is going okay. She has no other concerns at this time.  Objective: Blood pressure 108/82, pulse 94, temperature 98.2 F (36.8 C), resp. rate 18, height 5\' 3"  (1.6 m), weight 54.4 kg, last menstrual period 03/08/2020, SpO2 99 %, unknown if currently breastfeeding.  Physical Exam:  General: alert, cooperative, and no distress Lochia: appropriate Uterine Fundus: firm and below umbilicus  DVT Evaluation: no LE edema   Recent Labs    12/11/20 1016  HGB 13.4  HCT 40.8    Assessment/Plan: Karen Bender is a 30 y.o. G1P1001 on PPD# 1 s/p SVD.  Progressing well. Meeting postpartum milestones. VSS. Continue routine postpartum care.  Feeding: Breast Contraception: None  Dispo: Planning for discharge on PPD#2.    LOS: 2 days   Genia Del, MD  12/13/2020, 11:07 AM

## 2020-12-13 NOTE — Lactation Note (Signed)
This note was copied from a baby's chart. Lactation Consultation Note  Patient Name: Karen Bender [id number] Date: 12/13/2020 Reason for consult: Follow-up assessment Age:29 hours Mother had infant latched with poor latch. She reports that infant is sleeping on and off . Mother has tiny short nipples.  Assist mother with latching infant on the left breast while sitting up in the chair. Infant latched on in cross cradle hold. Infant sustained latch for 13 mins. With breast compression . Observed a few observed swallows.  Mother reports that this latch felt much deeper than others.  Mother was fit with a #20 NS, on the alternate breast. Nipple on the rt is flat and inverts when compressed.  infant suckled a few times but no latch sustained. Infant tired out.   Advised mother to bottle feed formula.  Mother has a hand pump and was sized with a #21 flange.  Mother is candidate for a Stork pump.  Informed staff nurse.  Staff nurse to give mother supplemental guidelines for formula/ breastmilk.  Mother to supplement infant after each feeding.  Mother to pump for 15 mins on each breast every 3 hours. Mother to be set up with a DEBP if she stays in hospital and pump every 3 hours.   Discussed tx and prevention of engorgement. Mother to follow up with [facility] out pt dept.   Informed Peds MD of consult feeding assessment and mothers desire to go home. LC Suggested to mother that it would benefit her to stay over night to get more breastfeeding assistance.   Maternal Data    Feeding Mother's Current Feeding Choice: Breast Milk and Formula  LATCH Score Latch: Too sleepy or reluctant, no latch achieved, no sucking elicited.  Audible Swallowing: None  Type of Nipple: Flat  Comfort (Breast/Nipple): Soft / non-tender (firm breast tissue)  Hold (Positioning): Full assist, staff holds infant at breast  LATCH Score: 3   Lactation Tools Discussed/Used Tools: Nipple Shields Nipple shield size:  20  Interventions Interventions: Breast feeding basics reviewed;Assisted with latch;Skin to skin;Hand express;Pre-pump if needed;Breast compression;Adjust position;Support pillows;Position options;Hand pump  Discharge Discharge Education: Engorgement and breast care;Warning signs for feeding baby;Outpatient recommendation  Consult Status Consult Status: Follow-up Date: 12/13/20 Follow-up type: In-patient    Jess Barters [facility] 12/13/2020, 3:50 PM

## 2020-12-26 ENCOUNTER — Telehealth (HOSPITAL_COMMUNITY): Payer: Self-pay | Admitting: *Deleted

## 2020-12-26 NOTE — Telephone Encounter (Signed)
Attempted Hospital Discharge Follow-Up Call.  Voice mail box not set up.  Unable to leave a message.

## 2021-01-09 ENCOUNTER — Ambulatory Visit: Payer: BC Managed Care – PPO | Admitting: Advanced Practice Midwife

## 2021-01-09 NOTE — Progress Notes (Deleted)
° ° °  [place] Partum Visit Note  Karen Bender is a 30 y.o. G32P1001 female who presents for a postpartum visit. She is 4 weeks postpartum following a normal spontaneous vaginal delivery.  I have fully reviewed the prenatal and intrapartum course. The delivery was at 39.6 gestational weeks.  Anesthesia: epidural. Postpartum course has been ***. Baby is doing well***. Baby is feeding by {breast/bottle:69}. Bleeding {vag bleed:12292}. Bowel function is {normal:32111}. Bladder function is {normal:32111}. Patient {is/is not:9024} sexually active. Contraception method is none. Postpartum depression screening: {gen negative/positive:315881}.   The pregnancy intention screening data noted above was reviewed. Potential methods of contraception were discussed. The patient elected to proceed with No data recorded.    Health Maintenance Due  Topic Date Due   COVID-19 Vaccine (3 - Booster for Pfizer series) 11/04/2019    {Common ambulatory SmartLinks:19316}  Review of Systems {ros; complete:30496}  Objective:  There were no vitals taken for this visit.   General:  {gen appearance:16600}   Breasts:  {desc; normal/abnormal/not indicated:14647}  Lungs: {lung exam:16931}  Heart:  {heart exam:5510}  Abdomen: {abdomen exam:16834}   Wound {Wound assessment:11097}  GU exam:  {desc; normal/abnormal/not indicated:14647}       Assessment:    There are no diagnoses linked to this encounter.  *** postpartum exam.   Plan:   Essential components of care per ACOG recommendations:  1.  Mood and well being: Patient with {gen negative/positive:315881} depression screening today. Reviewed local resources for support.  - Patient tobacco use? {tobacco use:25506}  - hx of drug use? {yes/no:25505}    2. Infant care and feeding:  -Patient currently breastmilk feeding? {yes/no:25502}  -Social determinants of health (SDOH) reviewed in EPIC. No concerns***The following needs were identified***  3. Sexuality,  contraception and birth spacing - Patient {DOES_DOES XNT:70017} want a pregnancy in the next year.  Desired family size is {NUMBER 1-10:22536} children.  - Reviewed forms of contraception in tiered fashion. Patient desired {PLAN CONTRACEPTION:313102} today.   - Discussed birth spacing of 18 months  4. Sleep and fatigue -Encouraged family/partner/community support of 4 hrs of uninterrupted sleep to help with mood and fatigue  5. Physical Recovery  - Discussed patients delivery and complications. She describes her labor as {description:25511} - Patient had a {CHL AMB DELIVERY:[phone]}. Patient had a {laceration:25518} laceration. Perineal healing reviewed. Patient expressed understanding - Patient has urinary incontinence? {yes/no:25515} - Patient {ACTION; IS/IS CBS:49675916} safe to resume physical and sexual activity  6.  Health Maintenance - HM due items addressed {Yes or If no, why not?:20788} - Last pap smear  Diagnosis  Date Value Ref Range Status  11/03/2019   Final   - Negative for intraepithelial lesion or malignancy (NILM)   Pap smear {done:10129} at today's visit.  -Breast Cancer screening indicated? {indicated:25516}  7. Chronic Disease/Pregnancy Condition follow up: {Follow up:25499}  - PCP follow up  Lyndal Rainbow, [place] for [employer], [place]

## 2022-02-11 ENCOUNTER — Encounter: Payer: Self-pay | Admitting: *Deleted

## 2022-02-11 DIAGNOSIS — Z3480 Encounter for supervision of other normal pregnancy, unspecified trimester: Secondary | ICD-10-CM | POA: Insufficient documentation

## 2022-02-13 ENCOUNTER — Ambulatory Visit (INDEPENDENT_AMBULATORY_CARE_PROVIDER_SITE_OTHER): Payer: 59 | Admitting: Certified Nurse Midwife

## 2022-02-13 ENCOUNTER — Other Ambulatory Visit (HOSPITAL_COMMUNITY)
Admission: RE | Admit: 2022-02-13 | Discharge: 2022-02-13 | Disposition: A | Discharge status: Home / Self Care | Payer: 59 | Source: Ambulatory Visit | Attending: Certified Nurse Midwife | Admitting: Certified Nurse Midwife

## 2022-02-13 ENCOUNTER — Encounter: Payer: Self-pay | Admitting: Certified Nurse Midwife

## 2022-02-13 VITALS — BP 102/68 | HR 89 | Wt 95.0 lb

## 2022-02-13 DIAGNOSIS — Z3A11 11 weeks gestation of pregnancy: Secondary | ICD-10-CM

## 2022-02-13 DIAGNOSIS — Z23 Encounter for immunization: Secondary | ICD-10-CM | POA: Diagnosis not present

## 2022-02-13 DIAGNOSIS — Z3480 Encounter for supervision of other normal pregnancy, unspecified trimester: Secondary | ICD-10-CM | POA: Diagnosis present

## 2022-02-13 DIAGNOSIS — Z3481 Encounter for supervision of other normal pregnancy, first trimester: Secondary | ICD-10-CM

## 2022-02-13 NOTE — Progress Notes (Signed)
Subjective:   Karen Bender is a 32 y.o. G2P1001 at 35w1dby LMP, early ultrasound being seen today for her first obstetrical visit.  Her obstetrical history is significant for  none . Patient does intend to breast feed. Pregnancy history fully reviewed.  Patient reports nausea. She is eating dry plum, pickles, and lemons which help.  HISTORY: OB History  Gravida Para Term Preterm AB Living  '2 1 1 '$ 0 0 1  SAB IAB Ectopic Multiple Live Births  0 0 0 0 1    # Outcome Date GA Lbr Len/2nd Weight Sex Delivery Anes PTL Lv  2 Current           1 Term 12/12/20 376w6d4:05 / 00:58 6 lb 5.1 oz (2.866 kg) F Vag-Spont EPI, Local  LIV     Birth Comments: WNL     Name: Plotner,GIRL Tashya     Apgar1: 9  Apgar5: 9   Past Medical History:  Diagnosis Date   Anemia    Past Surgical History:  Procedure Laterality Date   NO PAST SURGERIES     Family History  Problem Relation Age of Onset   Diabetes Mother    Hypertension Mother    Healthy Father    Social History   Tobacco Use   Smoking status: Never   Smokeless tobacco: Never  Vaping Use   Vaping Use: Never used  Substance Use Topics   Alcohol use: No   Drug use: Never   No Known Allergies No current outpatient medications on file prior to visit.   No current facility-administered medications on file prior to visit.     Indications for ASA therapy (per uptodate) One of the following: Previous pregnancy with preeclampsia, especially early onset and with an adverse outcome No Multifetal gestation No Chronic hypertension No Type 1 or 2 diabetes mellitus No Chronic kidney disease No Autoimmune disease (antiphospholipid syndrome, systemic lupus erythematosus) No   Two or more of the following: Nulliparity No Obesity (body mass index >30 kg/m2) No Family history of preeclampsia in mother or sister No Age ?35 years No Sociodemographic characteristics (African American race, low socioeconomic level) No Personal risk factors  (eg, previous pregnancy with low birth weight or small for gestational age infant, previous adverse pregnancy outcome [eg, stillbirth], interval >10 years between pregnancies) No   Indications for early 1 hour GTT (per uptodate)  BMI >25 (>23 in Asian women) AND one of the following  Gestational diabetes mellitus in a previous pregnancy No Glycated hemoglobin ?5.7 percent (39 mmol/mol), impaired glucose tolerance, or impaired fasting glucose on previous testing No First-degree relative with diabetes No High-risk race/ethnicity (eg, African American, Latino, Native American, [place], Pacific Islander) No History of cardiovascular disease No Hypertension or on therapy for hypertension No High-density lipoprotein cholesterol level <35 mg/dL (0.90 mmol/L) and/or a triglyceride level >250 mg/dL (2.82 mmol/L) No Polycystic ovary syndrome No Physical inactivity No Other clinical condition associated with insulin resistance (eg, severe obesity, acanthosis nigricans) No Previous birth of an infant weighing ?4000 g No Previous stillbirth of unknown cause No   Exam   Vitals:   02/13/22 1022  BP: 102/68  Pulse: 89  Weight: 95 lb (43.1 kg)   Fetal Heart Rate (bpm): 154  Uterus:     Pelvic Exam: deferred                       System: General: well-developed, well-nourished female in no acute distress  Breast:  normal appearance, no masses or tenderness   Skin: normal coloration and turgor, no rashes   Neurologic: oriented, normal, negative, normal mood   Extremities: normal strength, tone, and muscle mass, ROM of all joints is normal   HEENT PERRLA, extraocular movement intact and sclera clear, anicteric   Mouth/Teeth mucous membranes moist, pharynx normal without lesions and dental hygiene good   Neck supple and no masses   Cardiovascular: regular rate and rhythm   Respiratory:  no respiratory distress, normal breath sounds   Abdomen: soft, non-tender; bowel sounds normal;  no masses,  no organomegaly     Assessment:   Pregnancy: G2P1001 Patient Active Problem List   Diagnosis Date Noted   Supervision of other normal pregnancy, antepartum 02/11/2022     Plan:  1. Supervision of other normal pregnancy, antepartum  2. [redacted] weeks gestation  Initial labs drawn. Early 1 hour GTT n/a Started on ASA n/a Flu vax -yes Continue prenatal vitamins, if cannot tolerate then take folic acid '1mg'$  daily Genetic Screening discussed, NIPS: requested. Ultrasound discussed; fetal anatomic survey: requested. Problem list reviewed and updated The nature of [place] for [facility] with multiple MDs and other Advanced Practice Providers was explained to patient; also emphasized that fellows, residents, and students are part of our team. Routine obstetric precautions reviewed Return in about 4 weeks (around 03/13/2022).   Julianne Handler 11:38 AM 02/13/22

## 2022-02-13 NOTE — Progress Notes (Signed)
Bedside U/S shows single IUP with CRL measring 39.80m  FHT 154

## 2022-02-14 LAB — GC/CHLAMYDIA PROBE AMP (~~LOC~~) NOT AT ARMC
Chlamydia: NEGATIVE
Comment: NEGATIVE
Comment: NORMAL
Neisseria Gonorrhea: NEGATIVE

## 2022-02-15 LAB — URINE CULTURE, OB REFLEX: Organism ID, Bacteria: NO GROWTH

## 2022-02-15 LAB — CULTURE, OB URINE

## 2022-02-17 LAB — PREGNANCY, INITIAL SCREEN
Antibody Screen: NEGATIVE
Basophils Absolute: 0.1 10*3/uL (ref 0.0–0.2)
Basos: 1 %
Bilirubin, UA: NEGATIVE
Chlamydia trachomatis, NAA: NEGATIVE
EOS (ABSOLUTE): 0.1 10*3/uL (ref 0.0–0.4)
Eos: 1 %
Glucose, UA: NEGATIVE
HCV Ab: NONREACTIVE
HIV Screen 4th Generation wRfx: NONREACTIVE
Hematocrit: 36.7 % (ref 34.0–46.6)
Hemoglobin: 11.7 g/dL (ref 11.1–15.9)
Hepatitis B Surface Ag: NEGATIVE
Immature Grans (Abs): 0 10*3/uL (ref 0.0–0.1)
Immature Granulocytes: 0 %
Leukocytes,UA: NEGATIVE
Lymphocytes Absolute: 2 10*3/uL (ref 0.7–3.1)
Lymphs: 18 %
MCH: 24.8 pg — ABNORMAL LOW (ref 26.6–33.0)
MCHC: 31.9 g/dL (ref 31.5–35.7)
MCV: 78 fL — ABNORMAL LOW (ref 79–97)
Monocytes Absolute: 0.5 10*3/uL (ref 0.1–0.9)
Monocytes: 5 %
Neisseria Gonorrhoeae by PCR: NEGATIVE
Neutrophils Absolute: 8.3 10*3/uL — ABNORMAL HIGH (ref 1.4–7.0)
Neutrophils: 75 %
Nitrite, UA: NEGATIVE
Platelets: 437 10*3/uL (ref 150–450)
Protein,UA: NEGATIVE
RBC, UA: NEGATIVE
RBC: 4.72 x10E6/uL (ref 3.77–5.28)
RDW: 13.1 % (ref 11.7–15.4)
RPR Ser Ql: NONREACTIVE
Rh Factor: POSITIVE
Rubella Antibodies, IGG: 13.9 index (ref >0.99)
Specific Gravity, UA: 1.024 (ref 1.005–1.030)
Urobilinogen, Ur: 0.2 mg/dL (ref 0.2–1.0)
WBC: 11.1 10*3/uL — ABNORMAL HIGH (ref 3.4–10.8)
pH, UA: 6 (ref 5.0–7.5)

## 2022-02-17 LAB — MICROSCOPIC EXAMINATION
Bacteria, UA: NONE SEEN
Casts: NONE SEEN /lpf
RBC, Urine: NONE SEEN /hpf (ref 0–2)
WBC, UA: NONE SEEN /hpf (ref 0–5)

## 2022-02-17 LAB — URINE CULTURE, OB REFLEX: Organism ID, Bacteria: NO GROWTH

## 2022-02-17 LAB — HEPATITIS C ANTIBODY: Hep C Virus Ab: NONREACTIVE

## 2022-02-17 LAB — HCV INTERPRETATION

## 2022-03-19 ENCOUNTER — Ambulatory Visit (INDEPENDENT_AMBULATORY_CARE_PROVIDER_SITE_OTHER): Payer: 59 | Admitting: Obstetrics and Gynecology

## 2022-03-19 VITALS — BP 91/63 | HR 84 | Wt 96.0 lb

## 2022-03-19 DIAGNOSIS — Z3A15 15 weeks gestation of pregnancy: Secondary | ICD-10-CM

## 2022-03-19 DIAGNOSIS — Z3480 Encounter for supervision of other normal pregnancy, unspecified trimester: Secondary | ICD-10-CM

## 2022-03-19 DIAGNOSIS — Z3482 Encounter for supervision of other normal pregnancy, second trimester: Secondary | ICD-10-CM

## 2022-03-19 NOTE — Progress Notes (Signed)
   PRENATAL VISIT NOTE  Subjective:  Karen Bender is a 32 y.o. G2P1001 at 75w5dbeing seen today for ongoing prenatal care.  She is currently monitored for the following issues for this low-risk pregnancy and has Supervision of other normal pregnancy, antepartum on their problem list.  Patient reports no complaints.  Contractions: Not present. Vag. Bleeding: None.  Movement: Absent. Denies leaking of fluid.   The following portions of the patient's history were reviewed and updated as appropriate: allergies, current medications, past family history, past medical history, past social history, past surgical history and problem list.   Objective:   Vitals:   03/19/22 1047  BP: 91/63  Pulse: 84  Weight: 96 lb (43.5 kg)    Fetal Status: Fetal Heart Rate (bpm): 147   Movement: Absent     General:  Alert, oriented and cooperative. Patient is in no acute distress.  Skin: Skin is warm and dry. No rash noted.   Cardiovascular: Normal heart rate noted  Respiratory: Normal respiratory effort, no problems with respiration noted  Abdomen: Soft, gravid, appropriate for gestational age.  Pain/Pressure: Absent     Pelvic: Cervical exam deferred        Extremities: Normal range of motion.  Edema: None  Mental Status: Normal mood and affect. Normal behavior. Normal judgment and thought content.   Assessment and Plan:  Pregnancy: G2P1001 at 17w5d1. Supervision of other normal pregnancy, antepartum  Planning NIPS testing.  BP good.  MFM usKoreardered.    Preterm labor symptoms and general obstetric precautions including but not limited to vaginal bleeding, contractions, leaking of fluid and fetal movement were reviewed in detail with the patient. Please refer to After Visit Summary for other counseling recommendations.   No follow-ups on file.  Future Appointments  Date Time Provider DeElmo3/26/2024  1:00 PM [facility] US[facility]2022  1:10 PM OzJanyth PupaDO  [facility]    JeNoni SaupeNP

## 2022-04-09 ENCOUNTER — Ambulatory Visit: Payer: 59 | Attending: Certified Nurse Midwife

## 2022-04-09 DIAGNOSIS — Z3689 Encounter for other specified antenatal screening: Secondary | ICD-10-CM | POA: Diagnosis not present

## 2022-04-09 DIAGNOSIS — Z363 Encounter for antenatal screening for malformations: Secondary | ICD-10-CM | POA: Diagnosis not present

## 2022-04-09 DIAGNOSIS — Z3A18 18 weeks gestation of pregnancy: Secondary | ICD-10-CM | POA: Diagnosis not present

## 2022-04-09 DIAGNOSIS — Z3482 Encounter for supervision of other normal pregnancy, second trimester: Secondary | ICD-10-CM | POA: Insufficient documentation

## 2022-04-09 DIAGNOSIS — Z3480 Encounter for supervision of other normal pregnancy, unspecified trimester: Secondary | ICD-10-CM | POA: Insufficient documentation

## 2022-04-18 ENCOUNTER — Encounter: Payer: 59 | Admitting: Obstetrics & Gynecology

## 2022-04-25 ENCOUNTER — Encounter: Payer: Self-pay | Admitting: Obstetrics & Gynecology

## 2022-04-25 ENCOUNTER — Ambulatory Visit (INDEPENDENT_AMBULATORY_CARE_PROVIDER_SITE_OTHER): Payer: 59 | Admitting: Obstetrics & Gynecology

## 2022-04-25 VITALS — BP 98/64 | HR 98 | Wt 97.0 lb

## 2022-04-25 DIAGNOSIS — Z3482 Encounter for supervision of other normal pregnancy, second trimester: Secondary | ICD-10-CM

## 2022-04-25 NOTE — Progress Notes (Signed)
  LOW-RISK PREGNANCY VISIT Patient name: Karen Bender MRN [id number]  Date of birth: 03-11-1990 Chief Complaint:   Routine Prenatal Visit  History of Present Illness:   Karen Bender is a 32 y.o. G12P1001 female at [redacted]w[redacted]d with an Estimated Date of Delivery: 09/05/22 being seen today for ongoing management of a low-risk pregnancy.   Recent COVID mild symptoms taking Robitussin    02/13/2022   10:39 AM 08/02/2020    2:53 PM  Depression screen PHQ 2/9  Decreased Interest 0 0  Down, Depressed, Hopeless 0 0  PHQ - 2 Score 0 0  Altered sleeping 0 0  Tired, decreased energy 2 0  Change in appetite 0 0  Feeling bad or failure about yourself  0 0  Trouble concentrating 0 0  Moving slowly or fidgety/restless 0 0  Suicidal thoughts 0 0  PHQ-9 Score 2 0    Today she reports no complaints. Contractions: Not present. Vag. Bleeding: None.  Movement: Present. denies leaking of fluid. Review of Systems:   Pertinent items are noted in HPI Denies abnormal vaginal discharge w/ itching/odor/irritation, headaches, visual changes, shortness of breath, chest pain, abdominal pain, severe nausea/vomiting, or problems with urination or bowel movements unless otherwise stated above. Pertinent History Reviewed:  Reviewed past medical,surgical, social, obstetrical and family history.  Reviewed problem list, medications and allergies.  Physical Assessment:   Vitals:   04/25/22 1451  BP: 98/64  Pulse: 98  Weight: 97 lb (44 kg)  Body mass index is 17.18 kg/m.        Physical Examination:   General appearance: Well appearing, and in no distress  Mental status: Alert, oriented to person, place, and time  Skin: Warm & dry  Respiratory: Normal respiratory effort, no distress  Abdomen: Soft, gravid, nontender  Pelvic: Cervical exam deferred         Extremities: Edema: None  Psych:  mood and affect appropriate  Fetal Status:     Movement: Present    Chaperone: n/a    No results found for this or any  previous visit (from the past 24 hour(s)).   Assessment & Plan:  1) Low-risk pregnancy G2P1001 at [redacted]w[redacted]d with an Estimated Date of Delivery: 09/05/22   -No acute concerns -Panorama still pending will check with natera -Requesting letter for sister to come visit postpartum and help with care   Meds: No orders of the defined types were placed in this encounter.  Labs/procedures today: none  Plan:  Continue routine obstetrical care  Next visit: prefers in person    Reviewed: Preterm labor symptoms and general obstetric precautions including but not limited to vaginal bleeding, contractions, leaking of fluid and fetal movement were reviewed in detail with the patient.  All questions were answered. Pt has home bp cuff. Check bp weekly, let us know if >140/90.   Follow-up: Return in about 4 weeks (around 05/23/2022) for LROB visit.  No orders of the defined types were placed in this encounter.   Myna Hidalgo, DO Attending Obstetrician & Gynecologist, [facility] for [employer], [facility]

## 2022-05-23 ENCOUNTER — Ambulatory Visit (INDEPENDENT_AMBULATORY_CARE_PROVIDER_SITE_OTHER): Payer: 59 | Admitting: Obstetrics and Gynecology

## 2022-05-23 VITALS — BP 113/73 | HR 88 | Wt 102.0 lb

## 2022-05-23 DIAGNOSIS — Z3480 Encounter for supervision of other normal pregnancy, unspecified trimester: Secondary | ICD-10-CM

## 2022-05-23 DIAGNOSIS — Z3A25 25 weeks gestation of pregnancy: Secondary | ICD-10-CM

## 2022-05-23 NOTE — Progress Notes (Signed)
   PRENATAL VISIT NOTE  Subjective:  Karen Bender is a 32 y.o. G2P1001 at [redacted]w[redacted]d being seen today for ongoing prenatal care.  She is currently monitored for the following issues for this low-risk pregnancy and has Supervision of other normal pregnancy, antepartum on their problem list.  Patient reports no complaints.  Contractions: Not present. Vag. Bleeding: None.  Movement: Present. Denies leaking of fluid.   The following portions of the patient's history were reviewed and updated as appropriate: allergies, current medications, past family history, past medical history, past social history, past surgical history and problem list.   Objective:   Vitals:   05/23/22 1429  BP: 113/73  Pulse: 88  Weight: 102 lb (46.3 kg)    Fetal Status: Fetal Heart Rate (bpm): 150 Fundal Height: 24 cm Movement: Present     General:  Alert, oriented and cooperative. Patient is in no acute distress.  Skin: Skin is warm and dry. No rash noted.   Cardiovascular: Normal heart rate noted  Respiratory: Normal respiratory effort, no problems with respiration noted  Abdomen: Soft, gravid, appropriate for gestational age.  Pain/Pressure: Absent      Assessment and Plan:  Pregnancy: G2P1001 at [redacted]w[redacted]d 1. Supervision of other normal pregnancy, antepartum 2. [redacted] weeks gestation of pregnancy Doing well, no complaints Has not received results from [place]. Will call rep today. Discussed 2h GTT and need for fasting at next appt. Discussed tdap, CBC, RPR, HIV next appt  Please refer to After Visit Summary for other counseling recommendations.   Return in about 4 weeks (around 06/20/2022) for return OB at 28-29 weeks with 2h GTT, CBC, RPR, HIV & tdap.  Lennart Pall, MD

## 2022-06-17 ENCOUNTER — Encounter: Payer: Self-pay | Admitting: Obstetrics and Gynecology

## 2022-06-17 ENCOUNTER — Ambulatory Visit (INDEPENDENT_AMBULATORY_CARE_PROVIDER_SITE_OTHER): Payer: 59 | Admitting: Obstetrics and Gynecology

## 2022-06-17 VITALS — BP 103/70 | HR 87 | Wt 104.0 lb

## 2022-06-17 DIAGNOSIS — Z3480 Encounter for supervision of other normal pregnancy, unspecified trimester: Secondary | ICD-10-CM

## 2022-06-17 NOTE — Addendum Note (Signed)
Addended by: Kathie Dike on: 06/17/2022 02:41 PM   Modules accepted: Orders

## 2022-06-17 NOTE — Progress Notes (Signed)
   PRENATAL VISIT NOTE  Subjective:  Karen Bender is a 32 y.o. G2P1001 at [redacted]w[redacted]d being seen today for ongoing prenatal care.  She is currently monitored for the following issues for this low-risk pregnancy and has Supervision of other normal pregnancy, antepartum on their problem list.  Patient reports no complaints.  Contractions: Not present. Vag. Bleeding: None.  Movement: Present. Denies leaking of fluid.   The following portions of the patient's history were reviewed and updated as appropriate: allergies, current medications, past family history, past medical history, past social history, past surgical history and problem list.   Objective:   Vitals:   06/17/22 0923  BP: 103/70  Pulse: 87  Weight: 104 lb (47.2 kg)    Fetal Status: Fetal Heart Rate (bpm): 136 Fundal Height: 27 cm Movement: Present     General:  Alert, oriented and cooperative. Patient is in no acute distress.  Skin: Skin is warm and dry. No rash noted.   Cardiovascular: Normal heart rate noted  Respiratory: Normal respiratory effort, no problems with respiration noted  Abdomen: Soft, gravid, appropriate for gestational age.  Pain/Pressure: Absent     Pelvic: Cervical exam deferred        Extremities: Normal range of motion.  Edema: None  Mental Status: Normal mood and affect. Normal behavior. Normal judgment and thought content.   Assessment and Plan:  Pregnancy: G2P1001 at [redacted]w[redacted]d 1. Supervision of other normal pregnancy, antepartum Patient is doing well without complaints Third trimester labs and glucola today Patient does not plan to use contraceptions Patient is researching pediatricians in the [facility] system - Glucose Tolerance, 2 Hours w/1 Hour - HIV antibody (with reflex) - CBC - RPR  Preterm labor symptoms and general obstetric precautions including but not limited to vaginal bleeding, contractions, leaking of fluid and fetal movement were reviewed in detail with the patient. Please refer to  After Visit Summary for other counseling recommendations.   Return in about 2 weeks (around 07/01/2022) for in person, ROB, Low risk.  No future appointments.  Catalina Antigua, MD

## 2022-06-18 LAB — RPR: RPR Ser Ql: NONREACTIVE

## 2022-06-18 LAB — GLUCOSE TOLERANCE, 2 HOURS W/ 1HR
Glucose, 1 hour: 114 mg/dL (ref 70–179)
Glucose, 2 hour: 112 mg/dL (ref 70–152)
Glucose, Fasting: 83 mg/dL (ref 70–91)

## 2022-06-18 LAB — CBC
Hematocrit: 32.2 % — ABNORMAL LOW (ref 34.0–46.6)
Hemoglobin: 9.9 g/dL — ABNORMAL LOW (ref 11.1–15.9)
MCH: 22.9 pg — ABNORMAL LOW (ref 26.6–33.0)
MCHC: 30.7 g/dL — ABNORMAL LOW (ref 31.5–35.7)
MCV: 75 fL — ABNORMAL LOW (ref 79–97)
Platelets: 454 10*3/uL — ABNORMAL HIGH (ref 150–450)
RBC: 4.32 x10E6/uL (ref 3.77–5.28)
RDW: 15 % (ref 11.7–15.4)
WBC: 9.7 10*3/uL (ref 3.4–10.8)

## 2022-06-18 LAB — HIV ANTIBODY (ROUTINE TESTING W REFLEX): HIV Screen 4th Generation wRfx: NONREACTIVE

## 2022-06-18 MED ORDER — FERROUS SULFATE 325 (65 FE) MG PO TBEC: 325.0000 mg | DELAYED_RELEASE_TABLET | ORAL | 3 refills | Status: DC

## 2022-06-18 NOTE — Addendum Note (Signed)
Addended by: Catalina Antigua on: 06/18/2022 08:30 AM   Modules accepted: Orders

## 2022-07-02 ENCOUNTER — Encounter: Payer: 59 | Admitting: Certified Nurse Midwife

## 2022-07-03 ENCOUNTER — Ambulatory Visit (INDEPENDENT_AMBULATORY_CARE_PROVIDER_SITE_OTHER): Payer: 59 | Admitting: Obstetrics and Gynecology

## 2022-07-03 VITALS — BP 107/71 | HR 103 | Wt 107.0 lb

## 2022-07-03 DIAGNOSIS — Z23 Encounter for immunization: Secondary | ICD-10-CM

## 2022-07-03 DIAGNOSIS — Z3480 Encounter for supervision of other normal pregnancy, unspecified trimester: Secondary | ICD-10-CM

## 2022-07-03 DIAGNOSIS — Z3A30 30 weeks gestation of pregnancy: Secondary | ICD-10-CM

## 2022-07-03 LAB — PANORAMA PRENATAL TEST FULL PANEL:PANORAMA TEST PLUS 5 ADDITIONAL MICRODELETIONS: FETAL FRACTION: 19.4

## 2022-07-03 NOTE — Patient Instructions (Signed)
[email or web address] 

## 2022-07-03 NOTE — Progress Notes (Signed)
   PRENATAL VISIT NOTE  Subjective:  Karen Bender is a 32 y.o. G2P1001 at [redacted]w[redacted]d being seen today for ongoing prenatal care.  She is currently monitored for the following issues for this low-risk pregnancy and has Supervision of other normal pregnancy, antepartum on their problem list.  Patient reports  notices less fetal movement when she is active & busy, but having frequent/normal movement right now and when she's at rest .  Reflux & congestion. Contractions: Not present. Vag. Bleeding: None.  Movement: Present. Denies leaking of fluid.   The following portions of the patient's history were reviewed and updated as appropriate: allergies, current medications, past family history, past medical history, past social history, past surgical history and problem list.   Objective:   Vitals:   07/03/22 1333  BP: 107/71  Pulse: (!) 103  Weight: 107 lb (48.5 kg)   Fetal Status: Fetal Heart Rate (bpm): 147 Fundal Height: 29 cm Movement: Present     General:  Alert, oriented and cooperative. Patient is in no acute distress.  Skin: Skin is warm and dry. No rash noted.   Cardiovascular: Normal heart rate noted  Respiratory: Normal respiratory effort, no problems with respiration noted  Abdomen: Soft, gravid, appropriate for gestational age.  Pain/Pressure: Absent      Assessment and Plan:  Pregnancy: G2P1001 at [redacted]w[redacted]d 1. Supervision of other normal pregnancy, antepartum 2. [redacted] weeks gestation of pregnancy Tdap today Discussed fetal kick counts if pt is ever worried about DFM & when to call or present for care Discussed OTC meds for acid reflux & congestion  Please refer to After Visit Summary for other counseling recommendations.   Return in about 2 weeks (around 07/17/2022) for return OB at 32 weeks.  Future Appointments  Date Time Provider Department Center  07/16/2022 10:10 AM Sue Lush, FNP [facility]  07/29/2022 10:10 AM Lesly Dukes, MD [facility]   08/12/2022 10:10 AM Lesly Dukes, MD [facility]   Lennart Pall, MD

## 2022-07-08 LAB — HORIZON CUSTOM: REPORT SUMMARY: NEGATIVE

## 2022-07-16 ENCOUNTER — Ambulatory Visit (INDEPENDENT_AMBULATORY_CARE_PROVIDER_SITE_OTHER): Payer: 59 | Admitting: Obstetrics and Gynecology

## 2022-07-16 ENCOUNTER — Encounter: Payer: Self-pay | Admitting: Obstetrics and Gynecology

## 2022-07-16 VITALS — BP 109/71 | HR 93 | Wt 108.0 lb

## 2022-07-16 DIAGNOSIS — Z3480 Encounter for supervision of other normal pregnancy, unspecified trimester: Secondary | ICD-10-CM

## 2022-07-16 DIAGNOSIS — Z3A32 32 weeks gestation of pregnancy: Secondary | ICD-10-CM

## 2022-07-16 DIAGNOSIS — O99013 Anemia complicating pregnancy, third trimester: Secondary | ICD-10-CM

## 2022-07-16 NOTE — Progress Notes (Signed)
   PRENATAL VISIT NOTE  Subjective:  Karen Bender is a 32 y.o. G2P1001 at [redacted]w[redacted]d being seen today for ongoing prenatal care.  She is currently monitored for the following issues for this low-risk pregnancy and has Supervision of other normal pregnancy, antepartum on their problem list.  Patient reports no complaints.  Contractions: Not present. Vag. Bleeding: None.  Movement: Present. Denies leaking of fluid.   The following portions of the patient's history were reviewed and updated as appropriate: allergies, current medications, past family history, past medical history, past social history, past surgical history and problem list.   Objective:   Vitals:   07/16/22 1426  BP: 109/71  Pulse: 93  Weight: 108 lb (49 kg)    Fetal Status: Fetal Heart Rate (bpm): 145 Fundal Height: 32 cm Movement: Present     General:  Alert, oriented and cooperative. Patient is in no acute distress.  Skin: Skin is warm and dry. No rash noted.   Cardiovascular: Normal heart rate noted  Respiratory: Normal respiratory effort, no problems with respiration noted  Abdomen: Soft, gravid, appropriate for gestational age.  Pain/Pressure: Absent     Pelvic: Cervical exam deferred        Extremities: Normal range of motion.  Edema: None  Mental Status: Normal mood and affect. Normal behavior. Normal judgment and thought content.   Assessment and Plan:  Pregnancy: G2P1001 at [redacted]w[redacted]d 1. Supervision of other normal pregnancy, antepartum BP and FHR normal Feeling regular fetal movement FH appropriate  2. Anemia affecting pregnancy in third trimester 6/3 hgb 9.9, started oral iron. Would like to wait til next visit for CBC  3. [redacted] weeks gestation of pregnancy Anticipatory guidance regarding upcoming appts Doing well overall, no concerns  Preterm labor symptoms and general obstetric precautions including but not limited to vaginal bleeding, contractions, leaking of fluid and fetal movement were reviewed in detail  with the patient. Please refer to After Visit Summary for other counseling recommendations.   Return in 2 weeks for routine prenatal care  Future Appointments  Date Time Provider Department Center  07/29/2022 10:10 AM Lesly Dukes, MD [facility]  08/12/2022 10:10 AM Lesly Dukes, MD [facility]    Albertine Grates, FNP

## 2022-07-27 IMAGING — US US MFM OB COMP +14 WKS
1 series · 12 of 28 positions shown · non-contrast
Comparison: none

[Series 1: us mfm ob comp +14 wks · 12 of 154 slices shown]
[im 6/154]
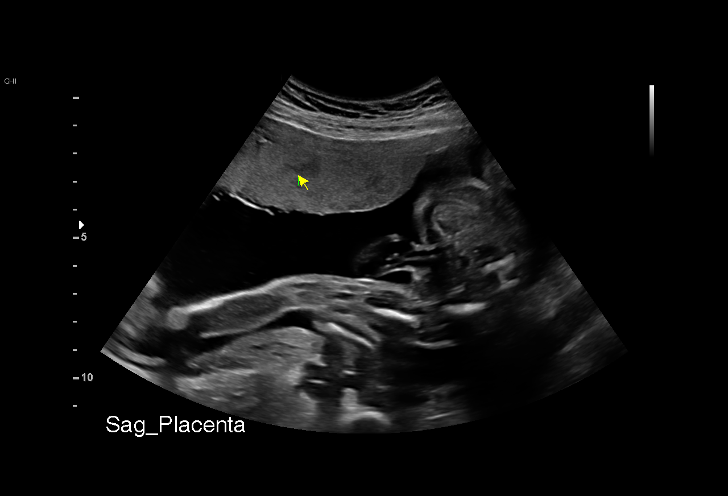
[im 18/154]
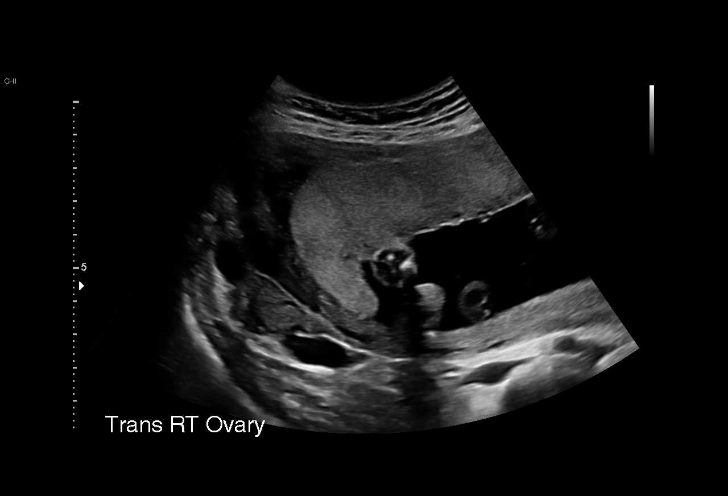
[im 29/154]
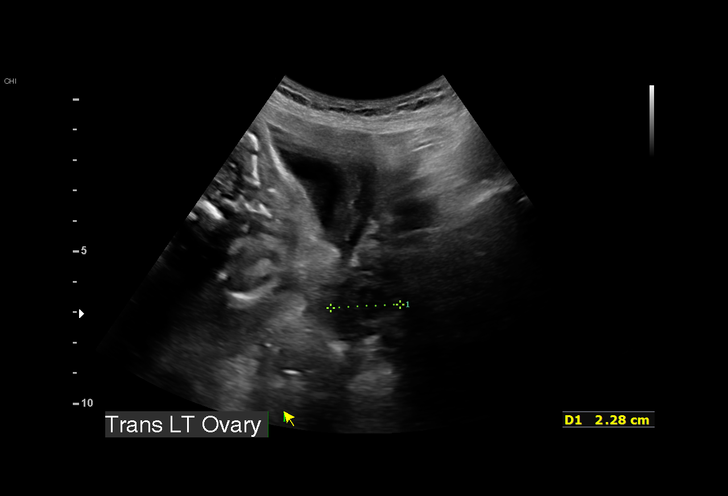
[im 46/154]
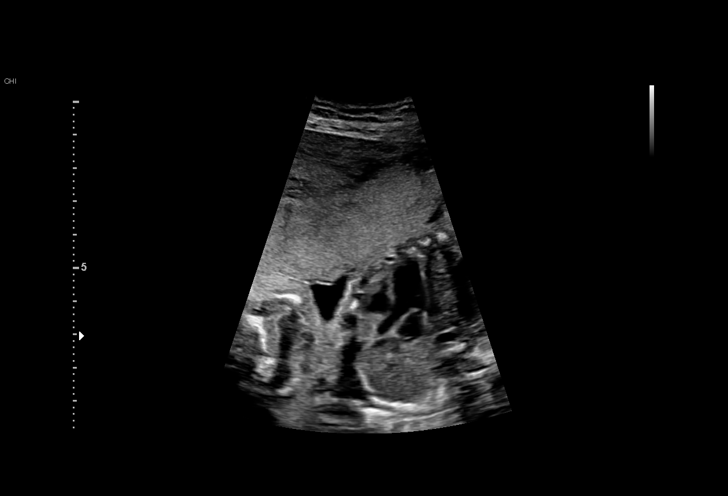
[im 57/154]
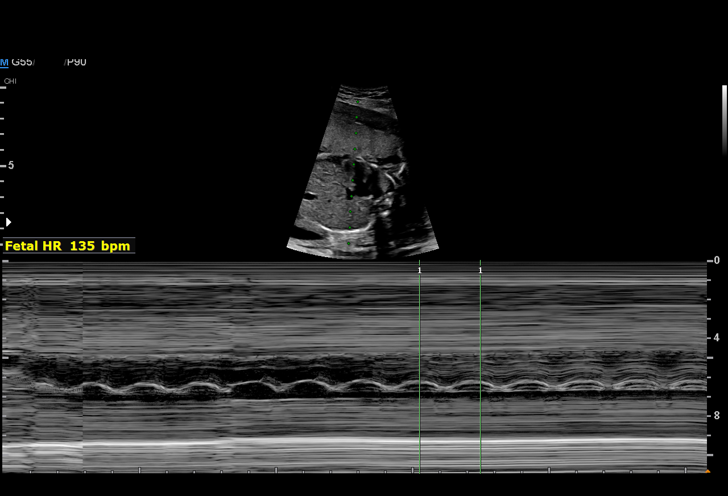
[im 69/154]
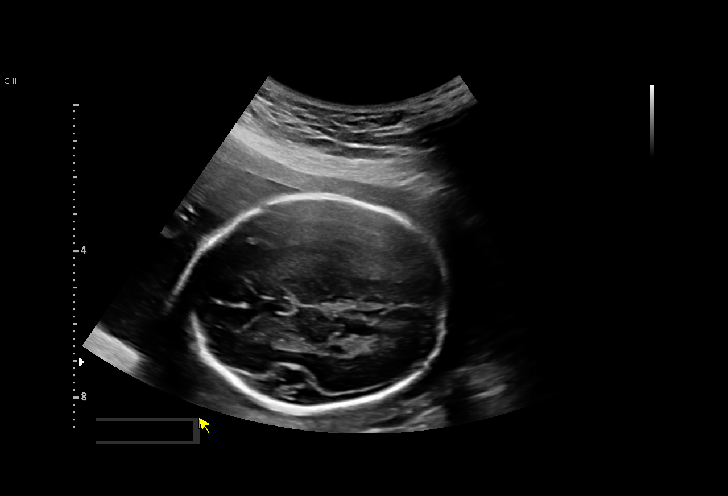
[im 86/154]
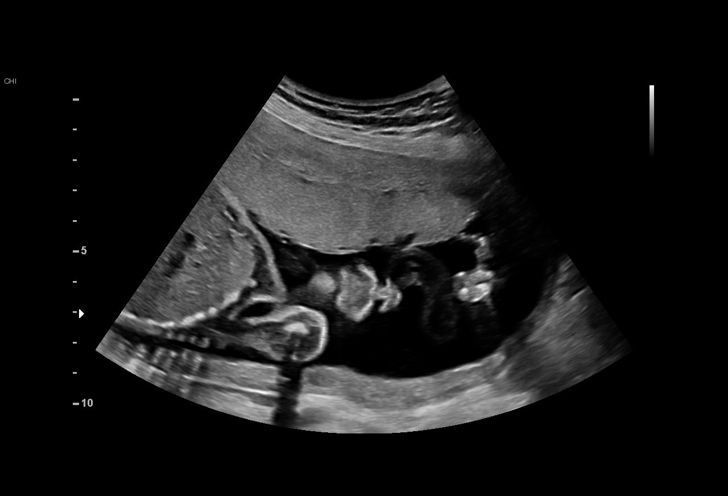
[im 97/154]
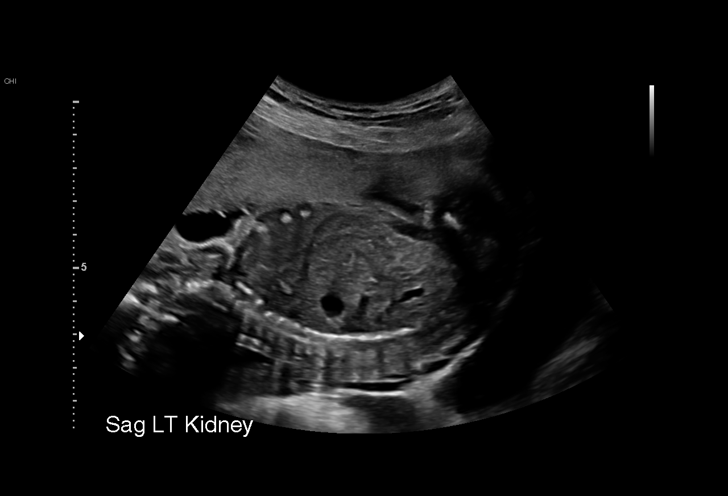
[im 108/154]
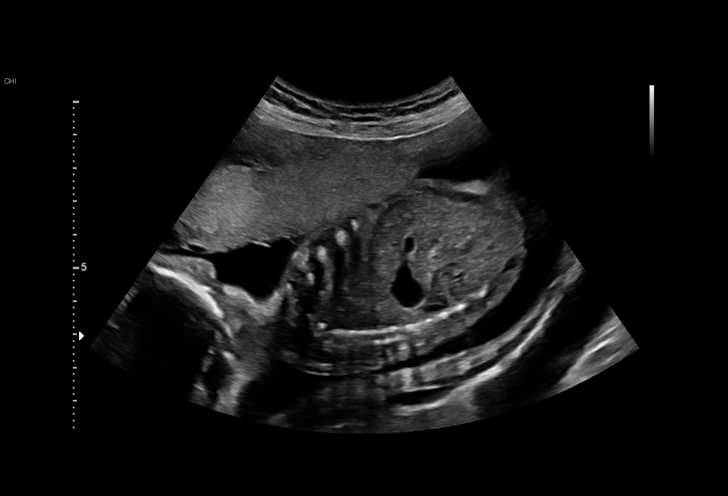
[im 125/154]
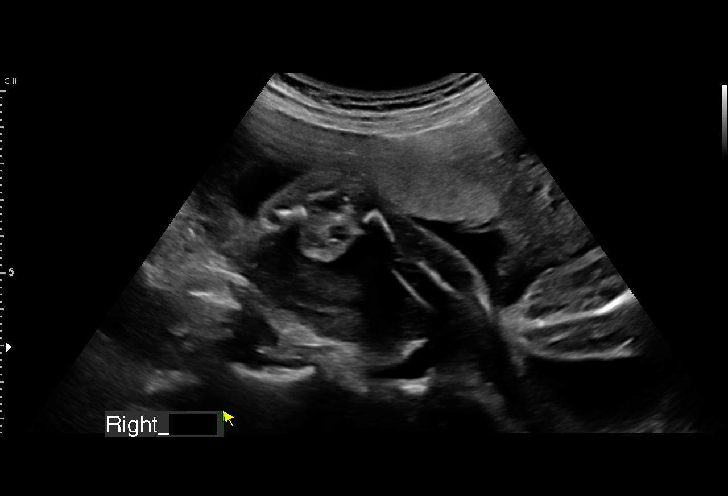
[im 137/154]
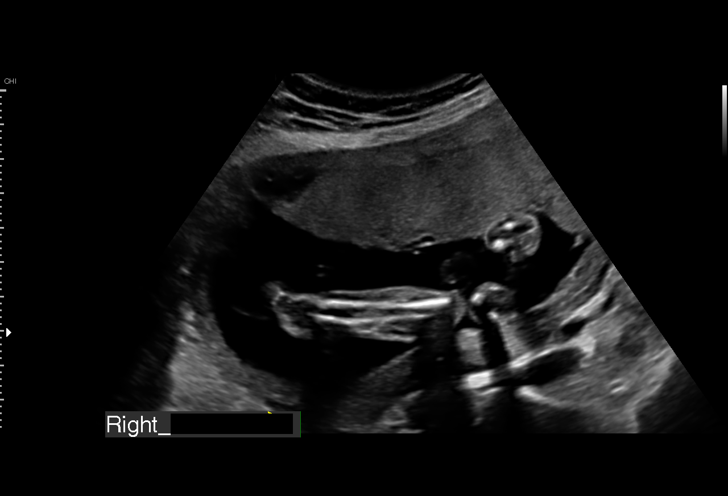
[im 148/154]
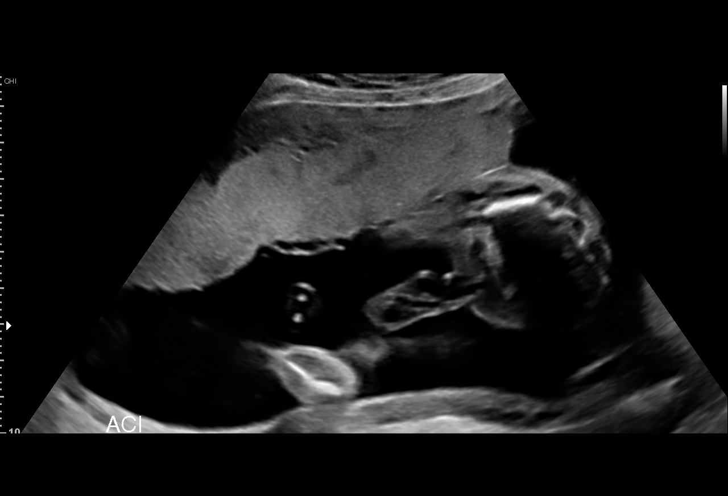

[12 of 28 positions shown; findings below may reference images not displayed]

Indications

 Fetal abnormality - other known or suspected
 Encounter for antenatal screening for
 malformations
 24 weeks gestation of pregnancy
 LR NIPS female
Fetal Evaluation

 Num Of Fetuses:         1
 Fetal Heart Rate(bpm):  135
 Cardiac Activity:       Observed
 Presentation:           Variable
 P. Cord Insertion:      Visualized, central

 Amniotic Fluid
 AFI FV:      Within normal limits

                             Largest Pocket(cm)

Biometry

 BPD:      57.6  mm     G. Age:  23w 4d         30  %    CI:        74.63   %    70 - 86
                                                         FL/HC:      19.3   %    18.7 -
 HC:      211.6  mm     G. Age:  23w 2d         10  %    HC/AC:      1.14        1.05 -
 AC:      186.1  mm     G. Age:  23w 3d         23  %    FL/BPD:     71.0   %    71 - 87
 FL:       40.9  mm     G. Age:  23w 2d         17  %    FL/AC:      22.0   %    20 - 24
 HUM:      39.4  mm     G. Age:  24w 0d         43  %
 CER:      24.3  mm     G. Age:  22w 2d         21  %

 LV:        3.7  mm
 CM:        5.6  mm
 Est. FW:     585  gm      1 lb 5 oz     16  %
OB History

 Gravidity:    1
Gestational Age

 LMP:           24w 0d        Date:  03/08/20                 EDD:   12/13/20
 U/S Today:     23w 3d                                        EDD:   12/17/20
 Best:          24w 0d     Det. By:  LMP  (03/08/20)          EDD:   12/13/20
Anatomy

 Cranium:               Appears normal         LVOT:                   Appears normal
 Cavum:                 Appears normal         Aortic Arch:            Appears normal
 Ventricles:            Appears normal         Ductal Arch:            Appears normal
 Choroid Plexus:        Appears normal         Diaphragm:              Appears normal
 Cerebellum:            Appears normal         Stomach:                Appears normal, left
                                                                       sided
 Posterior Fossa:       Appears normal         Abdomen:                Appears normal
 Nuchal Fold:           Not applicable (>20    Abdominal Wall:         Appears nml (cord
                        wks GA)                                        insert, abd wall)
 Face:                  Appears normal         Cord Vessels:           Appears normal (3
                        (orbits and profile)                           vessel cord)
 Lips:                  Appears normal         Kidneys:                Bilat pyelectasis,
                                                                       Rt5 mm, QtVmm
 Palate:                Not well visualized    Bladder:                Appears normal
 Thoracic:              Appears normal         Spine:                  Ltd views no
                                                                       intracranial signs of
                                                                       NTD
 Heart:                 Appears normal         Upper Extremities:      Appears normal
                        (4CH, axis, and
                        situs)
 RVOT:                  Appears normal         Lower Extremities:      Appears normal

 Other:  Fetus appears to be female. Nasal bone and lenses visualized.
         Heels/feet and open hands visualized. VC, 3VV and 3VTV visualized.
         Technically difficult due to fetal position.
Cervix Uterus Adnexa

 Cervix
 Length:           3.07  cm.
 Normal appearance by transabdominal scan.

 Uterus
 No abnormality visualized.

 Right Ovary
 Within normal limits.

 Left Ovary
 Within normal limits.

 Cul De Sac
 No free fluid seen.
 Adnexa
 No abnormality visualized.
Comments

 This patient was seen for a detailed fetal anatomy scan as
 she recently moved to the [place] from [place].
 She denies any significant past medical history and denies
 any problems in her current pregnancy.
 She had a cell free DNA test earlier in her pregnancy which
 indicated a low risk for trisomy 21, 18, and 13. A female fetus
 is predicted.
 She was informed that the fetal growth and amniotic fluid
 level were appropriate for her gestational age.
 Bilateral pyelectasis measuring  0.4 to 0.5 cm dilated was
 noted on today's ultrasound exam.  The implications and
 management of pylectasiswas discussed with the patient.
 She was advised that the pyelectasis noted on prenatal
 ultrasounds will often resolve spontaneously after birth.
 However, there may also be other processes such as an
 obstruction or reflux causing this finding that may require
 treatment after birth.  She was advised that we will continue
 to follow her closely to assess this finding.  Should the renal
 pelvis continue to enlarge, we will consider a referral to
 pediatric urology/nephrology for a prenatal consultation.
 The small association between pyelectasis and Down
 syndrome was discussed.  She was offered and declined an
 amniocentesis today for definitive diagnosis of fetal
 aneuploidy.  She was reassured by the low risk for Down
 syndrome indicated by her cell free DNA test.
 The patient was informed that anomalies may be missed due
 to technical limitations. If the fetus is in a suboptimal position
 or maternal habitus is increased, visualization of the fetus in
 the maternal uterus may be impaired.
 A follow-up growth scan was scheduled in 4 weeks to confirm
 her dates and to reassess the fetal kidneys.

## 2022-07-29 ENCOUNTER — Ambulatory Visit (INDEPENDENT_AMBULATORY_CARE_PROVIDER_SITE_OTHER): Payer: 59 | Admitting: Obstetrics & Gynecology

## 2022-07-29 DIAGNOSIS — R636 Underweight: Secondary | ICD-10-CM

## 2022-07-29 DIAGNOSIS — O99013 Anemia complicating pregnancy, third trimester: Secondary | ICD-10-CM

## 2022-07-29 NOTE — Progress Notes (Signed)
   PRENATAL VISIT NOTE  Subjective:  Karen Bender is a 32 y.o. G2P1001 at [redacted]w[redacted]d being seen today for ongoing prenatal care.  She is currently monitored for the following issues for this low-risk pregnancy and has Supervision of other normal pregnancy, antepartum on their problem list.  Patient reports no complaints.  Contractions: Not present. Vag. Bleeding: None.  Movement: Present. Denies leaking of fluid.   The following portions of the patient's history were reviewed and updated as appropriate: allergies, current medications, past family history, past medical history, past social history, past surgical history and problem list.   Objective:   Vitals:   07/29/22 1017  BP: 103/71  Pulse: 93  Weight: 112 lb (50.8 kg)    Fetal Status: Fetal Heart Rate (bpm): 143   Movement: Present     General:  Alert, oriented and cooperative. Patient is in no acute distress.  Skin: Skin is warm and dry. No rash noted.   Cardiovascular: Normal heart rate noted  Respiratory: Normal respiratory effort, no problems with respiration noted  Abdomen: Soft, gravid, appropriate for gestational age.  Pain/Pressure: Absent     Pelvic: Cervical exam deferred        Extremities: Normal range of motion.  Edema: None  Mental Status: Normal mood and affect. Normal behavior. Normal judgment and thought content.   Assessment and Plan:  Pregnancy: G2P1001 at [redacted]w[redacted]d   Size less than dates--fundal height 29--US for growth Underweight and low weight gain--Ensure Plus daily Anemia--taking iron and has rpt cbc today; If not improved will prescribe IV iron 4.  Not taking BPs--will start and put into baby Rx  Preterm labor symptoms and general obstetric precautions including but not limited to vaginal bleeding, contractions, leaking of fluid and fetal movement were reviewed in detail with the patient. Please refer to After Visit Summary for other counseling recommendations.   No follow-ups on file.  Future  Appointments  Date Time Provider Department Center  08/12/2022 10:10 AM Lesly Dukes, MD [facility]    Elsie Lincoln, MD

## 2022-07-29 NOTE — Addendum Note (Signed)
Addended by: Kathie Dike on: 07/29/2022 11:05 AM   Modules accepted: Orders

## 2022-07-30 LAB — CBC
Hematocrit: 30.8 % — ABNORMAL LOW (ref 34.0–46.6)
Hemoglobin: 9.5 g/dL — ABNORMAL LOW (ref 11.1–15.9)
MCH: 22.1 pg — ABNORMAL LOW (ref 26.6–33.0)
MCHC: 30.8 g/dL — ABNORMAL LOW (ref 31.5–35.7)
MCV: 72 fL — ABNORMAL LOW (ref 79–97)
Platelets: 458 10*3/uL — ABNORMAL HIGH (ref 150–450)
RBC: 4.29 x10E6/uL (ref 3.77–5.28)
RDW: 15.8 % — ABNORMAL HIGH (ref 11.7–15.4)
WBC: 10.7 10*3/uL (ref 3.4–10.8)

## 2022-07-31 ENCOUNTER — Telehealth: Payer: Self-pay

## 2022-07-31 ENCOUNTER — Other Ambulatory Visit: Payer: Self-pay | Admitting: Obstetrics & Gynecology

## 2022-07-31 DIAGNOSIS — O99019 Anemia complicating pregnancy, unspecified trimester: Secondary | ICD-10-CM | POA: Insufficient documentation

## 2022-07-31 DIAGNOSIS — O99013 Anemia complicating pregnancy, third trimester: Secondary | ICD-10-CM

## 2022-07-31 NOTE — Telephone Encounter (Signed)
Auth Submission: NO AUTH NEEDED Site of care: Site of care: [facility] Payer: [employer] Medication & CPT/J Code(s) submitted: Venofer (Iron Sucrose) [id number]  Auth type: Buy/Bill Units/visits requested: 200mg  x 5 doses  Approval from: 07/31/2022 to 12/01/2022

## 2022-08-02 ENCOUNTER — Encounter: Payer: Self-pay | Admitting: Obstetrics & Gynecology

## 2022-08-02 ENCOUNTER — Telehealth: Payer: Self-pay | Admitting: Pharmacy Technician

## 2022-08-02 NOTE — Telephone Encounter (Signed)
Dr. Jearld Lesch, [facility] note:  Patient will be scheduled as soon as possible.  Auth Submission: NO AUTH NEEDED Site of care: Site of care: [facility] Payer: [employer] Medication & CPT/J Code(s) submitted: Venofer (Iron Sucrose) [id number] Route of submission (phone, fax, portal):  Phone # Fax # Auth type: Buy/Bill Units/visits requested: 2 Reference number:  Approval from: 08/02/22 to 12/03/22

## 2022-08-05 ENCOUNTER — Ambulatory Visit: Payer: 59 | Admitting: *Deleted

## 2022-08-05 ENCOUNTER — Ambulatory Visit: Payer: 59 | Attending: Obstetrics & Gynecology

## 2022-08-05 ENCOUNTER — Encounter: Payer: Self-pay | Admitting: *Deleted

## 2022-08-05 VITALS — BP 111/68 | HR 88

## 2022-08-05 DIAGNOSIS — Z3A35 35 weeks gestation of pregnancy: Secondary | ICD-10-CM | POA: Diagnosis not present

## 2022-08-05 DIAGNOSIS — Z362 Encounter for other antenatal screening follow-up: Secondary | ICD-10-CM | POA: Diagnosis not present

## 2022-08-05 DIAGNOSIS — Z3480 Encounter for supervision of other normal pregnancy, unspecified trimester: Secondary | ICD-10-CM

## 2022-08-05 DIAGNOSIS — O26843 Uterine size-date discrepancy, third trimester: Secondary | ICD-10-CM

## 2022-08-12 ENCOUNTER — Ambulatory Visit (INDEPENDENT_AMBULATORY_CARE_PROVIDER_SITE_OTHER): Payer: 59 | Admitting: Obstetrics & Gynecology

## 2022-08-12 ENCOUNTER — Other Ambulatory Visit (HOSPITAL_COMMUNITY)
Admission: RE | Admit: 2022-08-12 | Discharge: 2022-08-12 | Disposition: A | Discharge status: Home / Self Care | Payer: 59 | Source: Ambulatory Visit | Attending: Obstetrics & Gynecology | Admitting: Obstetrics & Gynecology

## 2022-08-12 VITALS — BP 121/82 | HR 106 | Wt 114.0 lb

## 2022-08-12 DIAGNOSIS — Z3480 Encounter for supervision of other normal pregnancy, unspecified trimester: Secondary | ICD-10-CM

## 2022-08-12 DIAGNOSIS — O99013 Anemia complicating pregnancy, third trimester: Secondary | ICD-10-CM

## 2022-08-12 DIAGNOSIS — Z3A36 36 weeks gestation of pregnancy: Secondary | ICD-10-CM | POA: Insufficient documentation

## 2022-08-12 NOTE — Progress Notes (Signed)
   PRENATAL VISIT NOTE  Subjective:  Karen Bender is a 32 y.o. G2P1001 at [redacted]w[redacted]d being seen today for ongoing prenatal care.  She is currently monitored for the following issues for this high-risk pregnancy and has Supervision of other normal pregnancy, antepartum and Anemia complicating pregnancy on their problem list.  Patient reports no complaints.  Contractions: Irregular.  .  Movement: Present. Denies leaking of fluid.   The following portions of the patient's history were reviewed and updated as appropriate: allergies, current medications, past family history, past medical history, past social history, past surgical history and problem list.   Objective:   Vitals:   08/12/22 1013  BP: 121/82  Pulse: (!) 106  Weight: 114 lb (51.7 kg)    Fetal Status: Fetal Heart Rate (bpm): 152   Movement: Present     General:  Alert, oriented and cooperative. Patient is in no acute distress.  Skin: Skin is warm and dry. No rash noted.   Cardiovascular: Normal heart rate noted  Respiratory: Normal respiratory effort, no problems with respiration noted  Abdomen: Soft, gravid, appropriate for gestational age.  Pain/Pressure: Present     Pelvic: Cervical exam performed in the presence of a chaperone      2/40/bal/vtx  Extremities: Normal range of motion.     Mental Status: Normal mood and affect. Normal behavior. Normal judgment and thought content.   Assessment and Plan:  Pregnancy: G2P1001 at [redacted]w[redacted]d 1. Supervision of other normal pregnancy, antepartum Growth [place] 21% at 35 weeks; taking Ensure and gained 2 pounds in 2 weeks.  2. Anemia affecting pregnancy in third trimester IV iron ordered and has appt tomorrow  Term labor symptoms and general obstetric precautions including but not limited to vaginal bleeding, contractions, leaking of fluid and fetal movement were reviewed in detail with the patient. Please refer to After Visit Summary for other counseling recommendations.   No follow-ups on  file.  Future Appointments  Date Time Provider Department Center  08/13/2022  2:15 PM [facility] None  08/20/2022  1:50 PM Rasch, Harolyn Rutherford, NP [facility]  08/21/2022  1:15 PM [facility] None  08/27/2022  1:50 PM Sue Lush, FNP [facility]  09/03/2022  1:50 PM Rasch, Harolyn Rutherford, NP [facility]    Elsie Lincoln, MD

## 2022-08-13 ENCOUNTER — Ambulatory Visit (INDEPENDENT_AMBULATORY_CARE_PROVIDER_SITE_OTHER): Payer: 59

## 2022-08-13 VITALS — BP 110/77 | HR 88 | Temp 98.0°F | Resp 18 | Ht 63.0 in | Wt 114.0 lb

## 2022-08-13 DIAGNOSIS — Z3A36 36 weeks gestation of pregnancy: Secondary | ICD-10-CM | POA: Diagnosis not present

## 2022-08-13 DIAGNOSIS — D508 Other iron deficiency anemias: Secondary | ICD-10-CM

## 2022-08-13 DIAGNOSIS — O99013 Anemia complicating pregnancy, third trimester: Secondary | ICD-10-CM

## 2022-08-13 MED ORDER — SODIUM CHLORIDE 0.9 % IV SOLN
200.0000 mg | Freq: Once | INTRAVENOUS | Status: AC
  Administered 2022-08-13: 200 mg via INTRAVENOUS
  Filled 2022-08-13: qty 10

## 2022-08-13 MED ORDER — DIPHENHYDRAMINE HCL 25 MG PO CAPS
25.0000 mg | ORAL_CAPSULE | Freq: Once | ORAL | Status: AC
  Administered 2022-08-13: 25 mg via ORAL
  Filled 2022-08-13: qty 1

## 2022-08-13 MED ORDER — ACETAMINOPHEN 325 MG PO TABS
650.0000 mg | ORAL_TABLET | Freq: Once | ORAL | Status: AC
  Administered 2022-08-13: 650 mg via ORAL
  Filled 2022-08-13: qty 2

## 2022-08-13 NOTE — Progress Notes (Signed)
Diagnosis: Iron Deficiency Anemia  Provider:  Chilton Greathouse MD  Procedure: IV Infusion  IV Type: Peripheral, IV Location: L Antecubital  Venofer (Iron Sucrose), Dose: 200 mg  Infusion Start Time: 1444  Infusion Stop Time: 1502  Post Infusion IV Care: Observation period completed and Peripheral IV Discontinued  Discharge: Condition: Good, Destination: Home . AVS Provided  Performed by:  Adriana Mccallum, RN

## 2022-08-13 NOTE — Patient Instructions (Signed)
Iron Sucrose Injection What is this medication? IRON SUCROSE (EYE ern SOO krose) treats low levels of iron (iron deficiency anemia) in people with kidney disease. Iron is a mineral that plays an important role in making red blood cells, which carry oxygen from your lungs to the rest of your body. This medicine may be used for other purposes; ask your health care provider or pharmacist if you have questions. COMMON BRAND NAME(S): Venofer What should I tell my care team before I take this medication? They need to know if you have any of these conditions: Anemia not caused by low iron levels Heart disease High levels of iron in the blood Kidney disease Liver disease An unusual or allergic reaction to iron, other medications, foods, dyes, or preservatives Pregnant or trying to get pregnant Breastfeeding How should I use this medication? This medication is for infusion into a vein. It is given in a hospital or clinic setting. Talk to your care team about the use of this medication in children. While this medication may be prescribed for children as young as 2 years for selected conditions, precautions do apply. Overdosage: If you think you have taken too much of this medicine contact a poison control center or emergency room at once. NOTE: This medicine is only for you. Do not share this medicine with others. What if I miss a dose? Keep appointments for follow-up doses. It is important not to miss your dose. Call your care team if you are unable to keep an appointment. What may interact with this medication? Do not take this medication with any of the following: Deferoxamine Dimercaprol Other iron products This medication may also interact with the following: Chloramphenicol Deferasirox This list may not describe all possible interactions. Give your health care provider a list of all the medicines, herbs, non-prescription drugs, or dietary supplements you use. Also tell them if you smoke,  drink alcohol, or use illegal drugs. Some items may interact with your medicine. What should I watch for while using this medication? Visit your care team regularly. Tell your care team if your symptoms do not start to get better or if they get worse. You may need blood work done while you are taking this medication. You may need to follow a special diet. Talk to your care team. Foods that contain iron include: whole grains/cereals, dried fruits, beans, or peas, leafy green vegetables, and organ meats (liver, kidney). What side effects may I notice from receiving this medication? Side effects that you should report to your care team as soon as possible: Allergic reactions--skin rash, itching, hives, swelling of the face, lips, tongue, or throat Low blood pressure--dizziness, feeling faint or lightheaded, blurry vision Shortness of breath Side effects that usually do not require medical attention (report to your care team if they continue or are bothersome): Flushing Headache Joint pain Muscle pain Nausea Pain, redness, or irritation at injection site This list may not describe all possible side effects. Call your doctor for medical advice about side effects. You may report side effects to FDA at 1-800-FDA-1088. Where should I keep my medication? This medication is given in a hospital or clinic. It will not be stored at home. NOTE: This sheet is a summary. It may not cover all possible information. If you have questions about this medicine, talk to your doctor, pharmacist, or health care provider.  2024 Elsevier/Gold Standard (2022-06-07 00:00:00)

## 2022-08-20 ENCOUNTER — Ambulatory Visit (INDEPENDENT_AMBULATORY_CARE_PROVIDER_SITE_OTHER): Payer: 59 | Admitting: Obstetrics and Gynecology

## 2022-08-20 VITALS — BP 117/73 | HR 90 | Wt 114.0 lb

## 2022-08-20 DIAGNOSIS — Z3483 Encounter for supervision of other normal pregnancy, third trimester: Secondary | ICD-10-CM

## 2022-08-20 DIAGNOSIS — Z3A37 37 weeks gestation of pregnancy: Secondary | ICD-10-CM

## 2022-08-20 DIAGNOSIS — Z3480 Encounter for supervision of other normal pregnancy, unspecified trimester: Secondary | ICD-10-CM

## 2022-08-20 NOTE — Progress Notes (Signed)
   PRENATAL VISIT NOTE  Subjective:  Karen Bender is a 32 y.o. G2P1001 at [redacted]w[redacted]d being seen today for ongoing prenatal care.  She is currently monitored for the following issues for this low-risk pregnancy and has Supervision of other normal pregnancy, antepartum and Anemia complicating pregnancy on their problem list.  Patient reports no complaints.  Contractions: Irritability. Vag. Bleeding: None.  Movement: Present. Denies leaking of fluid.   The following portions of the patient's history were reviewed and updated as appropriate: allergies, current medications, past family history, past medical history, past social history, past surgical history and problem list.   Objective:   Vitals:   08/20/22 1356  BP: 117/73  Pulse: 90  Weight: 114 lb (51.7 kg)    Fetal Status: Fetal Heart Rate (bpm): 130   Movement: Present     General:  Alert, oriented and cooperative. Patient is in no acute distress.  Skin: Skin is warm and dry. No rash noted.   Cardiovascular: Normal heart rate noted  Respiratory: Normal respiratory effort, no problems with respiration noted  Abdomen: Soft, gravid, appropriate for gestational age.  Pain/Pressure: Absent     Pelvic: Cervical exam deferred        Extremities: Normal range of motion.  Edema: None  Mental Status: Normal mood and affect. Normal behavior. Normal judgment and thought content.   Assessment and Plan:  Pregnancy: G2P1001 at [redacted]w[redacted]d  Supervision of other normal pregnancy, antepartum  Doing well Starting to feel some cramping.  + fetal movement.   Term labor symptoms and general obstetric precautions including but not limited to vaginal bleeding, contractions, leaking of fluid and fetal movement were reviewed in detail with the patient. Please refer to After Visit Summary for other counseling recommendations.   No follow-ups on file.  Future Appointments  Date Time Provider Department Center  08/21/2022  1:15 PM [facility] 8 [facility] None   08/27/2022  1:50 PM Sue Lush, FNP [facility]  09/03/2022  1:50 PM Aslan Himes, Harolyn Rutherford, NP [facility]    Venia Carbon, NP

## 2022-08-21 ENCOUNTER — Ambulatory Visit (INDEPENDENT_AMBULATORY_CARE_PROVIDER_SITE_OTHER): Payer: 59

## 2022-08-21 VITALS — BP 101/66 | HR 85 | Temp 98.2°F | Resp 16 | Ht 63.0 in | Wt 114.6 lb

## 2022-08-21 DIAGNOSIS — D508 Other iron deficiency anemias: Secondary | ICD-10-CM | POA: Diagnosis not present

## 2022-08-21 DIAGNOSIS — Z3A37 37 weeks gestation of pregnancy: Secondary | ICD-10-CM

## 2022-08-21 DIAGNOSIS — O99013 Anemia complicating pregnancy, third trimester: Secondary | ICD-10-CM

## 2022-08-21 MED ORDER — ACETAMINOPHEN 325 MG PO TABS
650.0000 mg | ORAL_TABLET | Freq: Once | ORAL | Status: AC
  Administered 2022-08-21: 650 mg via ORAL
  Filled 2022-08-21: qty 2

## 2022-08-21 MED ORDER — DIPHENHYDRAMINE HCL 25 MG PO CAPS
25.0000 mg | ORAL_CAPSULE | Freq: Once | ORAL | Status: AC
  Administered 2022-08-21: 25 mg via ORAL
  Filled 2022-08-21: qty 1

## 2022-08-21 MED ORDER — SODIUM CHLORIDE 0.9 % IV SOLN
200.0000 mg | Freq: Once | INTRAVENOUS | Status: AC
  Administered 2022-08-21: 200 mg via INTRAVENOUS
  Filled 2022-08-21: qty 10

## 2022-08-21 NOTE — Progress Notes (Signed)
Diagnosis: Iron Deficiency Anemia  Provider:  Chilton Greathouse MD  Procedure: IV Infusion  IV Type: Peripheral, IV Location: L Forearm  Venofer (Iron Sucrose), Dose: 200 mg  Infusion Start Time: 1349  Infusion Stop Time: 1404  Post Infusion IV Care: Patient declined observation and Peripheral IV Discontinued  Discharge: Condition: Good, Destination: Home . AVS Declined  Performed by:  Adriana Mccallum, RN

## 2022-08-25 IMAGING — US US MFM OB FOLLOW-UP
1 series · 14 of 28 positions shown · non-contrast
Comparison: none

[Series 1: us mfm ob follow-up · 14 of 79 slices shown]
[im 3/79]
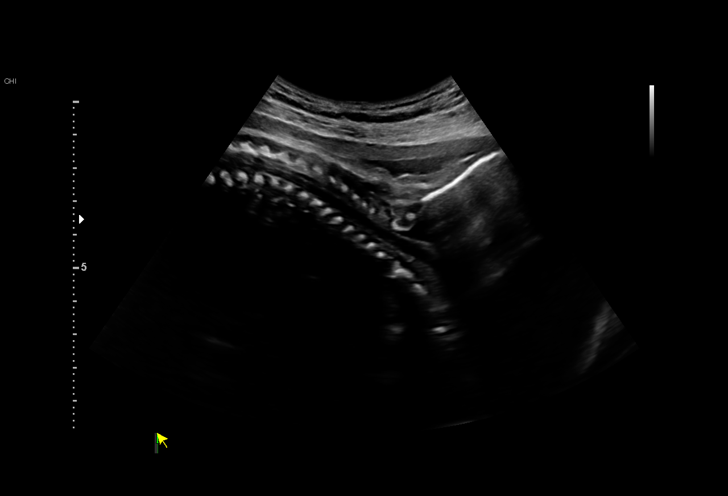
[im 9/79]
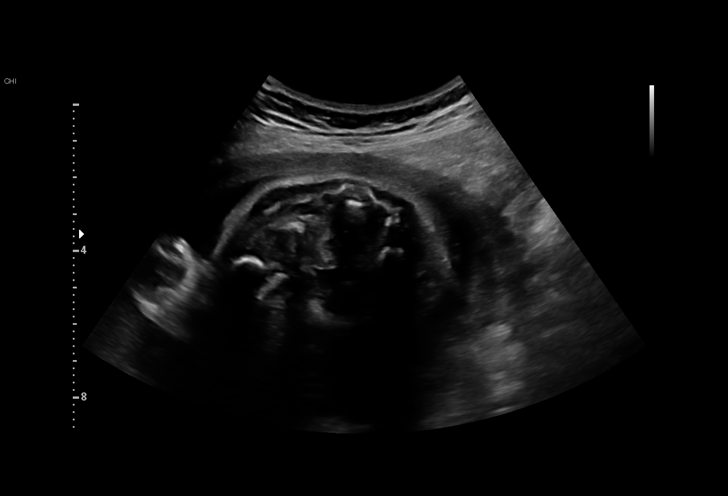
[im 15/79]
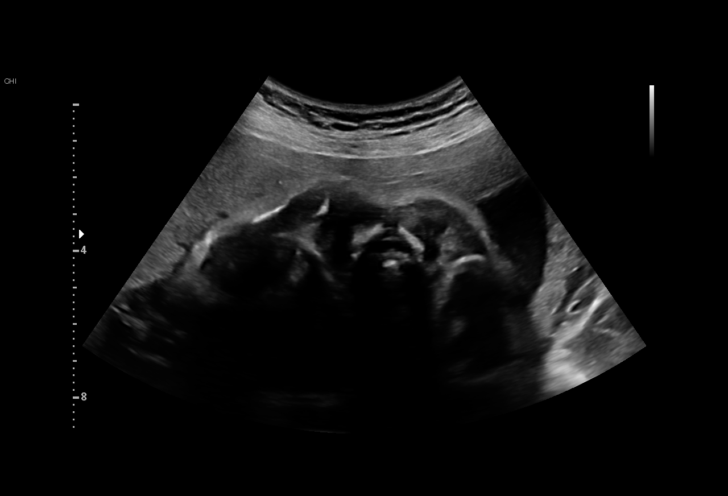
[im 21/79]
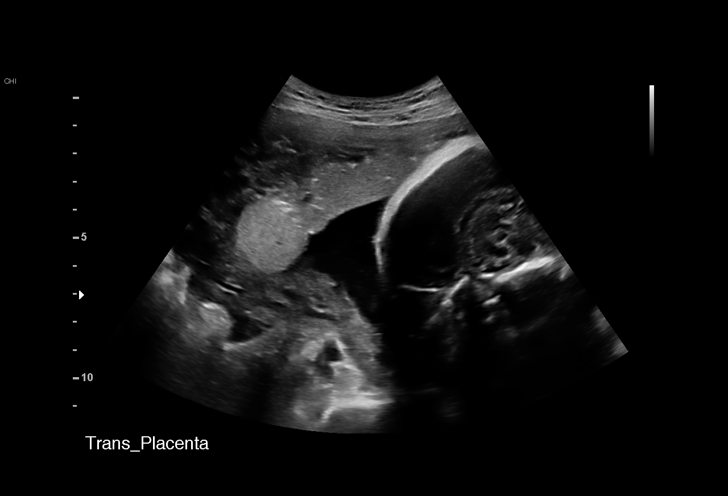
[im 27/79]
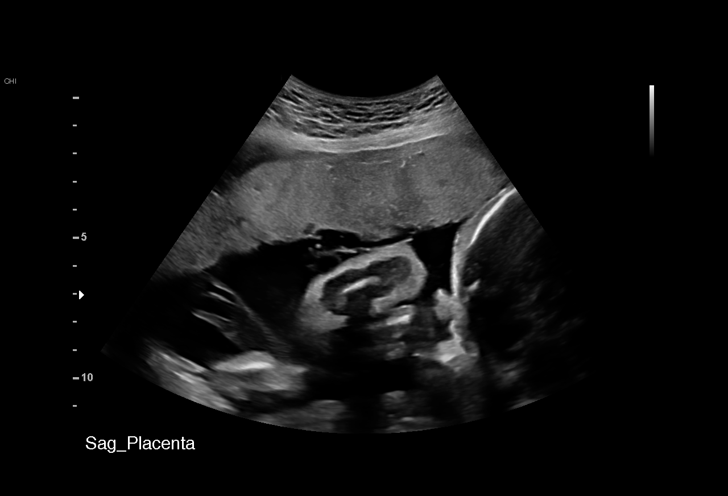
[im 32/79]
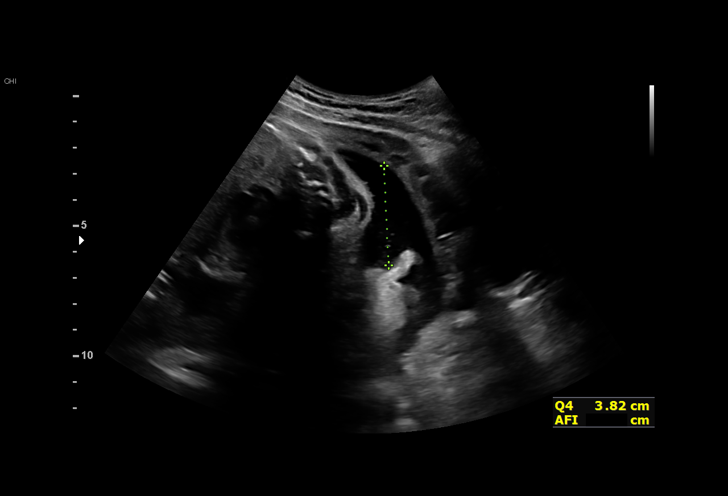
[im 38/79]
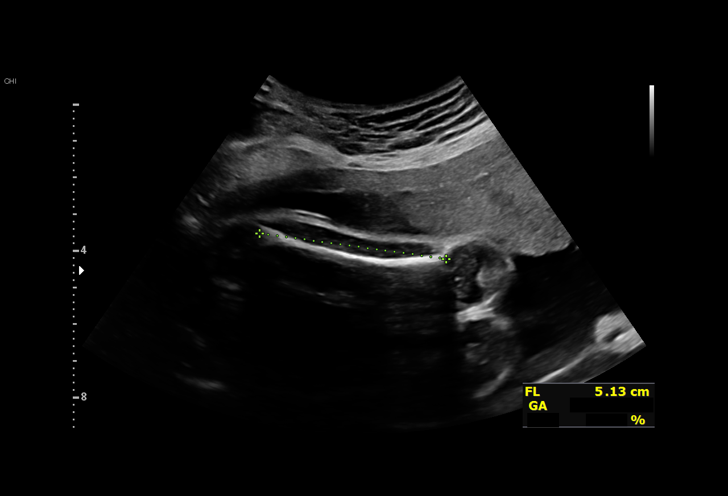
[im 44/79]
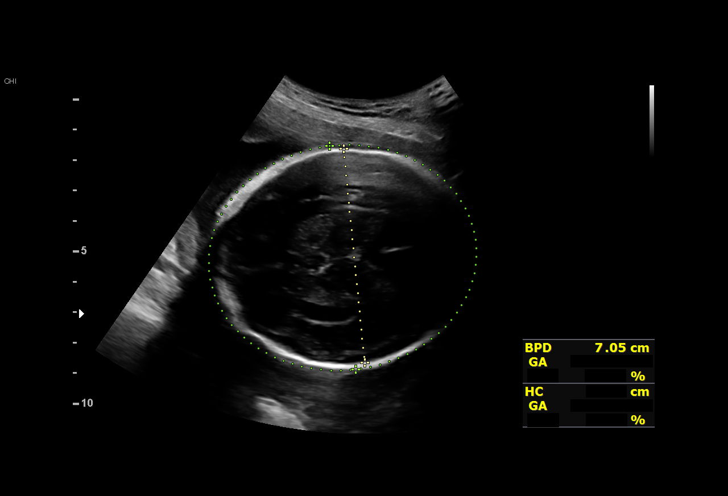
[im 50/79]
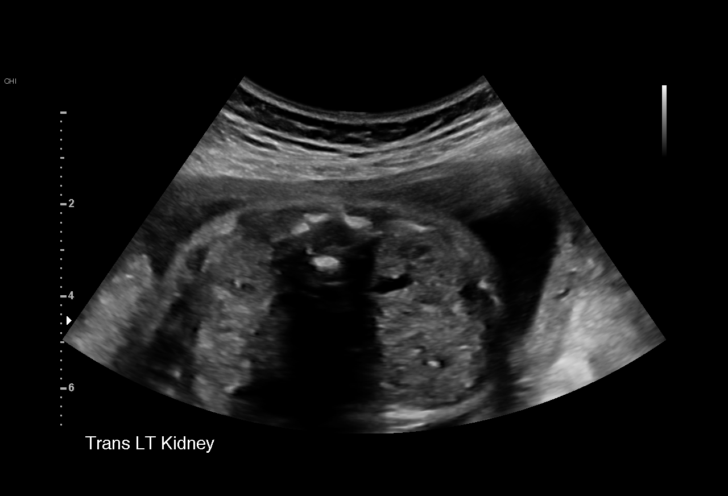
[im 55/79]
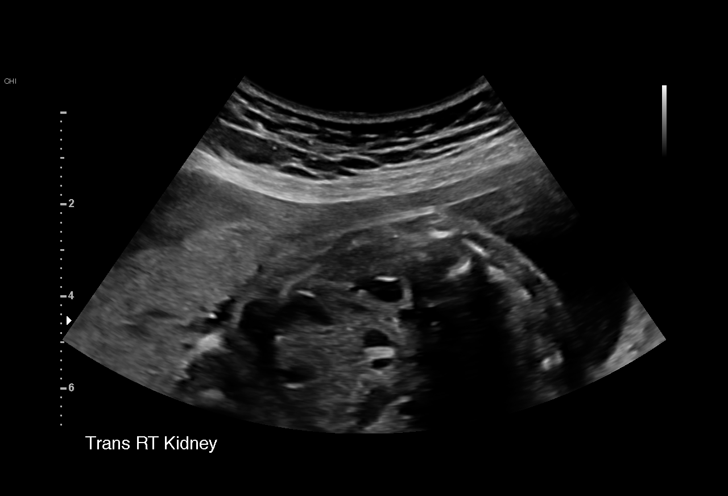
[im 61/79]
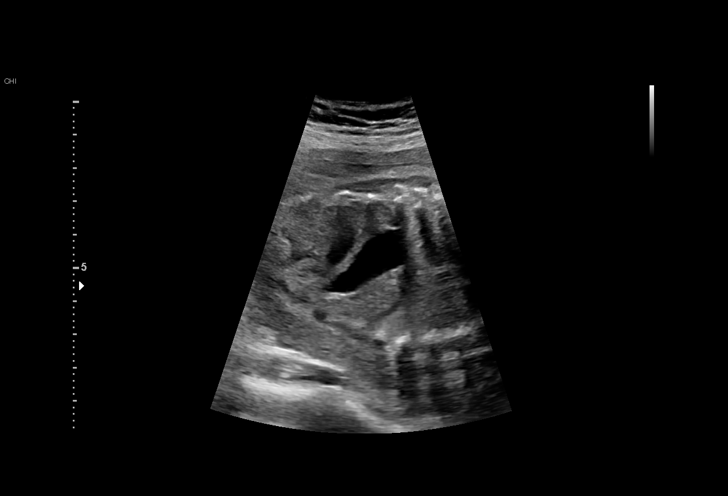
[im 67/79]
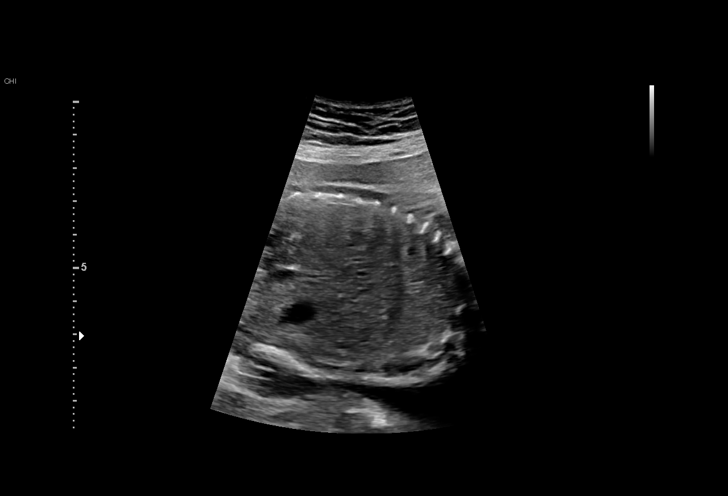
[im 73/79]
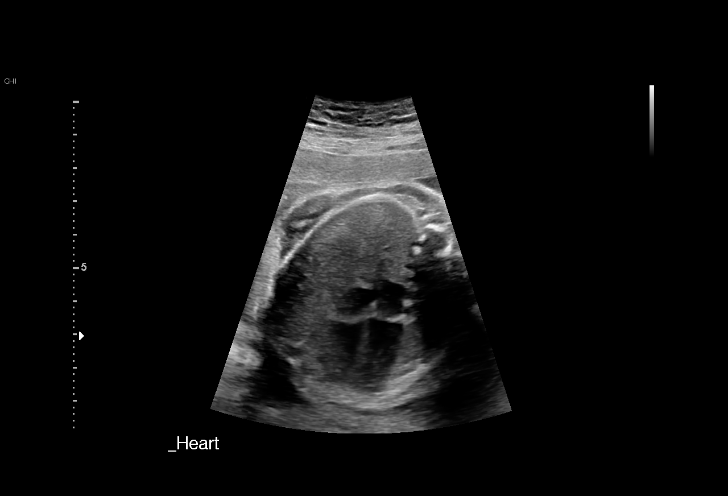
[im 79/79]
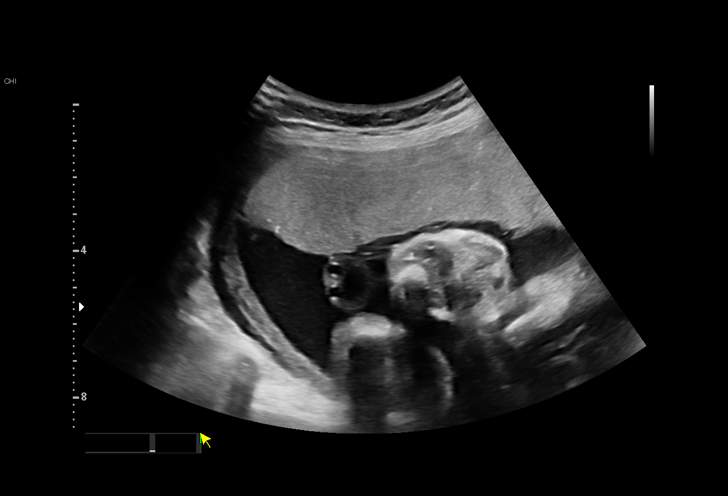

[14 of 28 positions shown; findings below may reference images not displayed]

Indications

 Pyelectasis of fetus on prenatal ultrasound
 LR NIPS female
 28 weeks gestation of pregnancy
Fetal Evaluation

 Num Of Fetuses:         1
 Fetal Heart Rate(bpm):  143
 Cardiac Activity:       Observed
 Presentation:           Cephalic
 Placenta:               Anterior
 P. Cord Insertion:      Previously Visualized

 Amniotic Fluid
 AFI FV:      Within normal limits

 AFI Sum(cm)     %Tile       Largest Pocket(cm)
 16.92           63

 RUQ(cm)       RLQ(cm)       LUQ(cm)        LLQ(cm)

Biometry

 BPD:        71  mm     G. Age:  28w 4d         50  %    CI:        77.88   %    70 - 86
                                                         FL/HC:      20.3   %    18.8 -
 HC:      254.6  mm     G. Age:  27w 5d         10  %    HC/AC:      1.10        1.05 -
 AC:       232   mm     G. Age:  27w 4d         25  %    FL/BPD:     73.0   %    71 - 87
 FL:       51.8  mm     G. Age:  27w 5d         22  %    FL/AC:      22.3   %    20 - 24
 LV:        6.4  mm
 Est. FW:    4459  gm      2 lb 7 oz     21  %
OB History

 Gravidity:    1
Gestational Age

 LMP:           28w 1d        Date:  03/08/20                 EDD:   12/13/20
 U/S Today:     27w 6d                                        EDD:   12/15/20
 Best:          28w 1d     Det. By:  LMP  (03/08/20)          EDD:   12/13/20
Anatomy

 Cranium:               Appears normal         Aortic Arch:            Previously seen
 Cavum:                 Appears normal         Ductal Arch:            Previously seen
 Ventricles:            Appears normal         Diaphragm:              Appears normal
 Choroid Plexus:        Previously seen        Stomach:                Appears normal, left
                                                                       sided
 Cerebellum:            Previously seen        Abdomen:                Appears normal
 Posterior Fossa:       Previously seen        Abdominal Wall:         Previously seen
 Nuchal Fold:           Not applicable (>20    Cord Vessels:           Appears normal (3
                        wks GA)                                        vessel cord)
 Face:                  Orbits and profile     Kidneys:                Appear normal
                        previously seen
 Lips:                  Previously seen        Bladder:                Appears normal
 Thoracic:              Appears normal         Spine:                  Appears normal
 Heart:                 Appears normal         Upper Extremities:      Previously seen
                        (4CH, axis, and
                        situs)
 RVOT:                  Previously seen        Lower Extremities:      Previously seen
 LVOT:                  Previously seen

 Other:  Previously fetus appears to be female. Nasal bone and lenses prev.
         visualized. Heels/feet and open hands prev. visualized. VC, 3VV and
         3VTV prev. visualized.
Cervix Uterus Adnexa

 Cervix
 Not visualized (advanced GA >18wks)
Impression

 Follow up growth due to a prior exam with renal pyelectasis.
 Normal interval growth with measurements consistent with
 dates
 Good fetal movement and amniotic fluid volume

 The renal pyelectasis has resolved today.
Recommendations

 Follow up as clinically indicated.

## 2022-08-27 ENCOUNTER — Ambulatory Visit (INDEPENDENT_AMBULATORY_CARE_PROVIDER_SITE_OTHER): Payer: 59 | Admitting: Obstetrics and Gynecology

## 2022-08-27 VITALS — BP 110/72 | HR 86 | Wt 115.0 lb

## 2022-08-27 DIAGNOSIS — Z3A38 38 weeks gestation of pregnancy: Secondary | ICD-10-CM

## 2022-08-27 DIAGNOSIS — Z3480 Encounter for supervision of other normal pregnancy, unspecified trimester: Secondary | ICD-10-CM

## 2022-08-27 DIAGNOSIS — O99013 Anemia complicating pregnancy, third trimester: Secondary | ICD-10-CM

## 2022-08-27 NOTE — Progress Notes (Signed)
   PRENATAL VISIT NOTE  Subjective:  Karen Bender is a 32 y.o. G2P1001 at [redacted]w[redacted]d being seen today for ongoing prenatal care.  She is currently monitored for the following issues for this low-risk pregnancy and has Supervision of other normal pregnancy, antepartum and Anemia complicating pregnancy on their problem list.  Patient reports no complaints.  Contractions: Irritability. Vag. Bleeding: None.  Movement: Present. Denies leaking of fluid.   The following portions of the patient's history were reviewed and updated as appropriate: allergies, current medications, past family history, past medical history, past social history, past surgical history and problem list.   Objective:   Vitals:   08/27/22 1356  BP: 110/72  Pulse: 86  Weight: 115 lb (52.2 kg)    Fetal Status: Fetal Heart Rate (bpm): 138 Fundal Height: 36 cm Movement: Present  Presentation: Vertex  General:  Alert, oriented and cooperative. Patient is in no acute distress.  Skin: Skin is warm and dry. No rash noted.   Cardiovascular: Normal heart rate noted  Respiratory: Normal respiratory effort, no problems with respiration noted  Abdomen: Soft, gravid, appropriate for gestational age.  Pain/Pressure: Absent     Pelvic: Cervical exam performed in the presence of a chaperone Dilation: 2.5 Effacement (%): 60 Station: -3  Extremities: Normal range of motion.  Edema: None  Mental Status: Normal mood and affect. Normal behavior. Normal judgment and thought content.   Assessment and Plan:  Pregnancy: G2P1001 at [redacted]w[redacted]d 1. Supervision of other normal pregnancy, antepartum BP and FHR normal Feeling regular fetal movement 7/22 u/s EFW 21%, AC 27%- follow up as clinically indicated  2. [redacted] weeks gestation of pregnancy Desired SVE 2.5/60/-3, discussed labor precautions  3. Anemia affecting pregnancy in third trimester 7/15 hgb 9.5, completed iv iron, last dose 8/7  Term labor symptoms and general obstetric precautions  including but not limited to vaginal bleeding, contractions, leaking of fluid and fetal movement were reviewed in detail with the patient. Please refer to After Visit Summary for other counseling recommendations.   Return in one week for routine prenatal   Future Appointments  Date Time Provider Department Center  09/03/2022  1:50 PM Rasch, Harolyn Rutherford, NP [facility]    Albertine Grates, FNP

## 2022-08-30 ENCOUNTER — Inpatient Hospital Stay (HOSPITAL_COMMUNITY): Payer: 59 | Admitting: Anesthesiology

## 2022-08-30 ENCOUNTER — Inpatient Hospital Stay (HOSPITAL_COMMUNITY)
Admission: AD | Admit: 2022-08-30 | Discharge: 2022-08-31 | DRG: 807 | Disposition: A | Discharge status: Home / Self Care | Payer: 59 | Attending: Obstetrics and Gynecology | Admitting: Obstetrics and Gynecology

## 2022-08-30 ENCOUNTER — Other Ambulatory Visit: Payer: Self-pay

## 2022-08-30 ENCOUNTER — Encounter (HOSPITAL_COMMUNITY): Payer: Self-pay | Admitting: Obstetrics and Gynecology

## 2022-08-30 DIAGNOSIS — O99820 Streptococcus B carrier state complicating pregnancy: Secondary | ICD-10-CM | POA: Diagnosis not present

## 2022-08-30 DIAGNOSIS — O9902 Anemia complicating childbirth: Secondary | ICD-10-CM | POA: Diagnosis present

## 2022-08-30 DIAGNOSIS — O26893 Other specified pregnancy related conditions, third trimester: Secondary | ICD-10-CM | POA: Diagnosis present

## 2022-08-30 DIAGNOSIS — O99824 Streptococcus B carrier state complicating childbirth: Secondary | ICD-10-CM | POA: Diagnosis present

## 2022-08-30 DIAGNOSIS — O99019 Anemia complicating pregnancy, unspecified trimester: Secondary | ICD-10-CM | POA: Diagnosis present

## 2022-08-30 DIAGNOSIS — D509 Iron deficiency anemia, unspecified: Secondary | ICD-10-CM | POA: Diagnosis present

## 2022-08-30 DIAGNOSIS — O4202 Full-term premature rupture of membranes, onset of labor within 24 hours of rupture: Secondary | ICD-10-CM | POA: Diagnosis not present

## 2022-08-30 DIAGNOSIS — Z3480 Encounter for supervision of other normal pregnancy, unspecified trimester: Secondary | ICD-10-CM

## 2022-08-30 DIAGNOSIS — Z3A39 39 weeks gestation of pregnancy: Secondary | ICD-10-CM

## 2022-08-30 LAB — CBC
HCT: 39.8 % (ref 36.0–46.0)
Hemoglobin: 12.1 g/dL (ref 12.0–15.0)
MCH: 23.3 pg — ABNORMAL LOW (ref 26.0–34.0)
MCHC: 30.4 g/dL (ref 30.0–36.0)
MCV: 76.5 fL — ABNORMAL LOW (ref 80.0–100.0)
Platelets: 397 10*3/uL (ref 150–400)
RBC: 5.2 MIL/uL — ABNORMAL HIGH (ref 3.87–5.11)
RDW: 23.7 % — ABNORMAL HIGH (ref 11.5–15.5)
WBC: 11.1 10*3/uL — ABNORMAL HIGH (ref 4.0–10.5)
nRBC: 0 % (ref 0.0–0.2)

## 2022-08-30 LAB — TYPE AND SCREEN
ABO/RH(D): A POS
Antibody Screen: NEGATIVE

## 2022-08-30 LAB — RPR: RPR Ser Ql: NONREACTIVE

## 2022-08-30 MED ORDER — SODIUM CHLORIDE 0.9% FLUSH: 3.0000 mL | Freq: Two times a day (BID) | INTRAVENOUS | Status: DC

## 2022-08-30 MED ORDER — EPHEDRINE 5 MG/ML INJ: 10.0000 mg | INTRAVENOUS | Status: DC | PRN

## 2022-08-30 MED ORDER — OXYCODONE-ACETAMINOPHEN 5-325 MG PO TABS: 1.0000 | ORAL_TABLET | ORAL | Status: DC | PRN

## 2022-08-30 MED ORDER — MEASLES, MUMPS & RUBELLA VAC IJ SOLR: 0.5000 mL | Freq: Once | INTRAMUSCULAR | Status: DC

## 2022-08-30 MED ORDER — ONDANSETRON HCL 4 MG/2ML IJ SOLN: 4.0000 mg | Freq: Four times a day (QID) | INTRAMUSCULAR | Status: DC | PRN

## 2022-08-30 MED ORDER — ONDANSETRON HCL 4 MG PO TABS: 4.0000 mg | ORAL_TABLET | ORAL | Status: DC | PRN

## 2022-08-30 MED ORDER — FENTANYL-BUPIVACAINE-NACL 0.5-0.125-0.9 MG/250ML-% EP SOLN
12.0000 mL/h | EPIDURAL | Status: DC | PRN
  Administered 2022-08-30: 12 mL/h via EPIDURAL
  Filled 2022-08-30: qty 250

## 2022-08-30 MED ORDER — OXYCODONE-ACETAMINOPHEN 5-325 MG PO TABS: 2.0000 | ORAL_TABLET | ORAL | Status: DC | PRN

## 2022-08-30 MED ORDER — DIPHENHYDRAMINE HCL 25 MG PO CAPS: 25.0000 mg | ORAL_CAPSULE | Freq: Four times a day (QID) | ORAL | Status: DC | PRN

## 2022-08-30 MED ORDER — LIDOCAINE HCL (PF) 1 % IJ SOLN: 30.0000 mL | INTRAMUSCULAR | Status: DC | PRN

## 2022-08-30 MED ORDER — LIDOCAINE-EPINEPHRINE (PF) 1.5 %-1:200000 IJ SOLN
INTRAMUSCULAR | Status: DC | PRN
  Administered 2022-08-30: 5 mL via EPIDURAL

## 2022-08-30 MED ORDER — OXYTOCIN-SODIUM CHLORIDE 30-0.9 UT/500ML-% IV SOLN
2.5000 [IU]/h | INTRAVENOUS | Status: DC
  Administered 2022-08-30: 2.5 [IU]/h via INTRAVENOUS
  Filled 2022-08-30: qty 500

## 2022-08-30 MED ORDER — LACTATED RINGERS IV SOLN: 500.0000 mL | INTRAVENOUS | Status: DC | PRN

## 2022-08-30 MED ORDER — SIMETHICONE 80 MG PO CHEW: 80.0000 mg | CHEWABLE_TABLET | ORAL | Status: DC | PRN

## 2022-08-30 MED ORDER — ACETAMINOPHEN 325 MG PO TABS: 650.0000 mg | ORAL_TABLET | ORAL | Status: DC | PRN

## 2022-08-30 MED ORDER — ONDANSETRON HCL 4 MG/2ML IJ SOLN: 4.0000 mg | INTRAMUSCULAR | Status: DC | PRN

## 2022-08-30 MED ORDER — SENNOSIDES-DOCUSATE SODIUM 8.6-50 MG PO TABS: 2.0000 | ORAL_TABLET | Freq: Every day | ORAL | Status: DC

## 2022-08-30 MED ORDER — PHENYLEPHRINE 80 MCG/ML (10ML) SYRINGE FOR IV PUSH (FOR BLOOD PRESSURE SUPPORT): 80.0000 ug | PREFILLED_SYRINGE | INTRAVENOUS | Status: DC | PRN

## 2022-08-30 MED ORDER — WITCH HAZEL-GLYCERIN EX PADS
1.0000 | MEDICATED_PAD | CUTANEOUS | Status: DC | PRN
  Administered 2022-08-30: 1 via TOPICAL

## 2022-08-30 MED ORDER — TETANUS-DIPHTH-ACELL PERTUSSIS 5-2.5-18.5 LF-MCG/0.5 IM SUSY: 0.5000 mL | PREFILLED_SYRINGE | Freq: Once | INTRAMUSCULAR | Status: DC

## 2022-08-30 MED ORDER — COCONUT OIL OIL: 1.0000 | TOPICAL_OIL | Status: DC | PRN

## 2022-08-30 MED ORDER — DIBUCAINE (PERIANAL) 1 % EX OINT: 1.0000 | TOPICAL_OINTMENT | CUTANEOUS | Status: DC | PRN

## 2022-08-30 MED ORDER — BENZOCAINE-MENTHOL 20-0.5 % EX AERO
1.0000 | INHALATION_SPRAY | CUTANEOUS | Status: DC | PRN
  Administered 2022-08-30: 1 via TOPICAL
  Filled 2022-08-30: qty 56

## 2022-08-30 MED ORDER — WITCH HAZEL-GLYCERIN EX PADS: 1.0000 | MEDICATED_PAD | CUTANEOUS | Status: DC | PRN

## 2022-08-30 MED ORDER — SODIUM CHLORIDE 0.9% FLUSH: 3.0000 mL | INTRAVENOUS | Status: DC | PRN

## 2022-08-30 MED ORDER — IBUPROFEN 600 MG PO TABS
600.0000 mg | ORAL_TABLET | Freq: Four times a day (QID) | ORAL | Status: DC
  Administered 2022-08-30 – 2022-08-31 (×4): 600 mg via ORAL
  Filled 2022-08-30 (×4): qty 1

## 2022-08-30 MED ORDER — SODIUM CHLORIDE 0.9 % IV SOLN
2.0000 g | Freq: Once | INTRAVENOUS | Status: AC
  Administered 2022-08-30: 2 g via INTRAVENOUS
  Filled 2022-08-30: qty 2000

## 2022-08-30 MED ORDER — DIPHENHYDRAMINE HCL 50 MG/ML IJ SOLN: 12.5000 mg | INTRAMUSCULAR | Status: DC | PRN

## 2022-08-30 MED ORDER — SENNOSIDES-DOCUSATE SODIUM 8.6-50 MG PO TABS
2.0000 | ORAL_TABLET | ORAL | Status: DC
  Administered 2022-08-30: 2 via ORAL
  Filled 2022-08-30: qty 2

## 2022-08-30 MED ORDER — PRENATAL MULTIVITAMIN CH: 1.0000 | ORAL_TABLET | Freq: Every day | ORAL | Status: DC

## 2022-08-30 MED ORDER — OXYTOCIN BOLUS FROM INFUSION
333.0000 mL | Freq: Once | INTRAVENOUS | Status: AC
  Administered 2022-08-30: 333 mL via INTRAVENOUS

## 2022-08-30 MED ORDER — PRENATAL MULTIVITAMIN CH
1.0000 | ORAL_TABLET | Freq: Every day | ORAL | Status: DC
  Administered 2022-08-31: 1 via ORAL
  Filled 2022-08-30: qty 1

## 2022-08-30 MED ORDER — IBUPROFEN 600 MG PO TABS: 600.0000 mg | ORAL_TABLET | Freq: Four times a day (QID) | ORAL | Status: DC

## 2022-08-30 MED ORDER — LACTATED RINGERS IV SOLN: INTRAVENOUS | Status: DC

## 2022-08-30 MED ORDER — SODIUM CHLORIDE 0.9 % IV SOLN: 250.0000 mL | INTRAVENOUS | Status: DC | PRN

## 2022-08-30 MED ORDER — BENZOCAINE-MENTHOL 20-0.5 % EX AERO: 1.0000 | INHALATION_SPRAY | CUTANEOUS | Status: DC | PRN

## 2022-08-30 MED ORDER — ZOLPIDEM TARTRATE 5 MG PO TABS: 5.0000 mg | ORAL_TABLET | Freq: Every evening | ORAL | Status: DC | PRN

## 2022-08-30 MED ORDER — LACTATED RINGERS IV SOLN: 500.0000 mL | Freq: Once | INTRAVENOUS | Status: DC

## 2022-08-30 MED ORDER — SODIUM CHLORIDE 0.9 % IV SOLN: 1.0000 g | INTRAVENOUS | Status: DC

## 2022-08-30 MED ORDER — SOD CITRATE-CITRIC ACID 500-334 MG/5ML PO SOLN: 30.0000 mL | ORAL | Status: DC | PRN

## 2022-08-30 NOTE — Discharge Summary (Shared)
Postpartum Discharge Summary  Date of Service updated: 09/03/22     Patient Name: Karen Bender DOB: March 19, 1990 MRN: [id number]  Date of admission: 08/30/2022 Delivery date:08/30/2022 Delivering provider: Hermina Staggers Date of discharge: 09/03/2022  Admitting diagnosis: Normal labor [O80, Z37.9] Intrauterine pregnancy: [redacted]w[redacted]d     Secondary diagnosis:  Principal Problem:   Normal labor Active Problems:   Supervision of other normal pregnancy, antepartum   Anemia complicating pregnancy  Additional problems: Iron deficiency anemia s/p iron infusions prenatally    Discharge diagnosis: Term Pregnancy Delivered                                              Post partum procedures: none Augmentation: N/A Complications: None  Hospital course: Onset of Labor With Vaginal Delivery      32 y.o. yo [id number] at [redacted]w[redacted]d was admitted in Active Labor on 08/30/2022. Labor course was complicated byiron deficiency anemia  Membrane Rupture Time/Date: 8:49 AM,08/30/2022  Delivery Method:Vaginal, Spontaneous Operative Delivery:N/A Episiotomy: None Lacerations:  2nd degree Patient had a postpartum course uncomplicated.  She is ambulating, tolerating a regular diet, passing flatus, and urinating well. Patient is discharged home in stable condition on 09/03/22.  Newborn Data: Birth date:08/30/2022 Birth time:1:45 PM Gender:Female Living status:Living Apgars:9 ,9  Weight:2970 g  Magnesium Sulfate received: No BMZ received: No Rhophylac:No MMR:Yes T-DaP:Given prenatally Flu: Yes Transfusion:No  Physical exam  Vitals:   08/30/22 1640 08/30/22 2039 08/31/22 0034 08/31/22 0504  BP: 119/63 110/75 103/70 108/79  Pulse: 88 (!) 104 97 95  Resp: 18 17 16 17   Temp: 97.8 F (36.6 C) 97.7 F (36.5 C) 97.6 F (36.4 C) (!) 97.4 F (36.3 C)  TempSrc:  Oral Oral Oral  SpO2: 99% 99% 97% 100%  Weight:      Height:       General: alert and no distress Lochia: appropriate Uterine Fundus:  firm Incision: N/A DVT Evaluation: No evidence of DVT seen on physical exam. Negative Homan's sign. Labs: Lab Results  Component Value Date   WBC 14.1 (H) 08/31/2022   HGB 10.2 (L) 08/31/2022   HCT 33.5 (L) 08/31/2022   MCV 74.6 (L) 08/31/2022   PLT 339 08/31/2022      Latest Ref Rng & Units 08/01/2013   11:34 PM  CMP  Glucose 70 - 99 mg/dL 147   BUN 6 - 23 mg/dL 10   Creatinine 8.29 - 1.10 mg/dL 5.62   Sodium 130 - 865 mEq/L 140   Potassium 3.7 - 5.3 mEq/L 4.0   Chloride 96 - 112 mEq/L 102   CO2 19 - 32 mEq/L 25   Calcium 8.4 - 10.5 mg/dL 78.4    Edinburgh Score:    08/31/2022    5:05 AM  Edinburgh Postnatal Depression Scale Screening Tool  I have been able to laugh and see the funny side of things. 0  I have looked forward with enjoyment to things. 0  I have blamed myself unnecessarily when things went wrong. 1  I have been anxious or worried for no good reason. 0  I have felt scared or panicky for no good reason. 0  Things have been getting on top of me. 1  I have been so unhappy that I have had difficulty sleeping. 0  I have felt sad or miserable. 0  I have been so unhappy that  I have been crying. 0  The thought of harming myself has occurred to me. 0  Edinburgh Postnatal Depression Scale Total 2     After visit meds:  Allergies as of 08/31/2022       Reactions   Pork-derived Products         Medication List     TAKE these medications    ferrous sulfate 325 (65 FE) MG EC tablet Take 1 tablet (325 mg total) by mouth every other day for 42 doses.   ibuprofen 600 MG tablet Commonly known as: ADVIL Take 1 tablet (600 mg total) by mouth every 6 (six) hours as needed.   multivitamin-prenatal 27-0.8 MG Tabs tablet Take 1 tablet by mouth daily at 12 noon.         Discharge home in stable condition Infant Feeding: Breast Infant Disposition:home with mother Discharge instruction: per After Visit Summary and Postpartum booklet. Activity: Advance as  tolerated. Pelvic rest for 6 weeks.  Diet: routine diet Future Appointments: Future Appointments  Date Time Provider Department Center  09/03/2022  1:50 PM Rasch, Harolyn Rutherford, NP [facility]   Follow up Visit:  Follow-up Information     [facility]. Go in 1 month(s).   Specialty: Obstetrics and Gynecology Why: call the office late next week if you haven't heard about this appointment Contact information: [address], [address] [place] [address] [phone]               Message sent to [facility] on 8/16  Please schedule this patient for a In person postpartum visit in 6 weeks with the following provider: Any provider. Additional Postpartum F/U:Postpartum Depression checkup  Low risk pregnancy complicated by:  iron deficiency anemia Delivery mode:  Vaginal, Spontaneous Anticipated Birth Control:   declined   09/03/2022 Wyn Forster, MD FMOB Fellow, Faculty practice [facility], Center for [facility]

## 2022-08-30 NOTE — Procedures (Signed)
Delivery Note At 1:45 PM a viable female was delivered via Vaginal, Spontaneous (Presentation: Right Occiput Anterior).  APGAR: 9, ; weight  .   Placenta status: Spontaneous, Intact.  Cord: 3 vessels with the following complications: None.    Anesthesia: Epidural Episiotomy: None Lacerations: 2nd degree Suture Repair: 3.0 Est. Blood Loss (mL): 300  Mom to postpartum.  Baby to Nursery.  Hermina Staggers 08/30/2022, 2:22 PM

## 2022-08-30 NOTE — MAU Note (Addendum)
.  Karen Bender is a 32 y.o. at [redacted]w[redacted]d here in MAU reporting: CTX's since yesterday evening that are now every 10 minutes. Reports bloody mucous. Denies LOF. +FM.  -GBS+. Last SVE on 8/13 patient was 2.560-3. -Anemia. Last hgb on 7/15 was 9.5 g/dL.  Onset of complaint: Yesterday evening Pain score: 6/10 lower abdomen and lower back  FHT: 145 initial external Lab orders placed from triage: MAU Labor Eval

## 2022-08-30 NOTE — Discharge Summary (Signed)
Postpartum Discharge Summary  Date of Service updated***     Patient Name: Karen Bender DOB: [birth year] MRN: [id number]  Date of admission: 08/30/2022 Delivery date:08/30/2022 Delivering provider: Hermina Staggers Date of discharge: 08/30/2022  Admitting diagnosis: Normal labor [O80, Z37.9] Intrauterine pregnancy: [redacted]w[redacted]d     Secondary diagnosis:  Principal Problem:   Normal labor Active Problems:   Supervision of other normal pregnancy, antepartum   Anemia complicating pregnancy  Additional problems: NA    Discharge diagnosis: Term Pregnancy Delivered                                              Post partum procedures:{Postpartum procedures:23558} Augmentation: N/A Complications: None  Hospital course: Karen Bender present with SOL at term. See admit H & P for additional information. Pt was admitted to L & D where she had an uneventful labor and delivery course. She delivery a viable female infant without problems. See delivery note for additional information. Pt's postpartum course was unremarkable. She progressed to ambulating, voiding, tolerating diet and good oral pain control.   Magnesium Sulfate received: No BMZ received: No Rhophylac:No MMR:No T-DaP:Given prenatally Flu: N/A Transfusion:No  Physical exam  Vitals:   08/30/22 1431 08/30/22 1446 08/30/22 1500 08/30/22 1529  BP: 114/80 114/82 103/71 103/66  Pulse: 89 92 78 80  Resp: 18  18 17   Temp:   98.4 F (36.9 C) 98.4 F (36.9 C)  TempSrc:      SpO2:    99%  Weight:      Height:       General: {Exam; general:21111117} Lochia: {Desc; appropriate/inappropriate:30686::"appropriate"} Uterine Fundus: {Desc; firm/soft:30687} Incision: {Exam; incision:21111123} DVT Evaluation: {Exam; dvt:2111122} Labs: Lab Results  Component Value Date   WBC 11.1 (H) 08/30/2022   HGB 12.1 08/30/2022   HCT 39.8 08/30/2022   MCV 76.5 (L) 08/30/2022   PLT 397 08/30/2022      Latest Ref Rng & Units 08/01/2013   11:34 PM   CMP  Glucose 70 - 99 mg/dL 409   BUN 6 - 23 mg/dL 10   Creatinine 8.11 - 1.10 mg/dL 9.14   Sodium 782 - 956 mEq/L 140   Potassium 3.7 - 5.3 mEq/L 4.0   Chloride 96 - 112 mEq/L 102   CO2 19 - 32 mEq/L 25   Calcium 8.4 - 10.5 mg/dL 21.3    Edinburgh Score:    12/13/2020    4:17 AM  Edinburgh Postnatal Depression Scale Screening Tool  I have been able to laugh and see the funny side of things. 0  I have looked forward with enjoyment to things. 0  I have blamed myself unnecessarily when things went wrong. 0  I have been anxious or worried for no good reason. 0  I have felt scared or panicky for no good reason. 0  Things have been getting on top of me. 1  I have been so unhappy that I have had difficulty sleeping. 0  I have felt sad or miserable. 0  I have been so unhappy that I have been crying. 0  The thought of harming myself has occurred to me. 0  Edinburgh Postnatal Depression Scale Total 1     After visit meds:  Allergies as of 08/30/2022       Reactions   Pork-derived Products      Med Rec must be  completed prior to using this [facility]***        Discharge home in stable condition Infant Feeding: {Baby feeding:23562} Infant Disposition:{CHL IP OB HOME WITH YSAYTK:16010} Discharge instruction: per After Visit Summary and Postpartum booklet. Activity: Advance as tolerated. Pelvic rest for 6 weeks.  Diet: {OB XNAT:55732202} Future Appointments: Future Appointments  Date Time Provider Department Center  09/03/2022  1:50 PM Rasch, Harolyn Rutherford, NP [facility]   Follow up Visit:   Please schedule this patient for a In person postpartum visit in 6 weeks with the following provider: Any provider. Additional Postpartum F/U:Postpartum Depression checkup  Low risk pregnancy complicated by:  NA Delivery mode:  Vaginal, Spontaneous Anticipated Birth Control:  Unsure   08/30/2022 Hermina Staggers, MD

## 2022-08-30 NOTE — Lactation Note (Signed)
This note was copied from a baby's chart. Lactation Consultation Note Mom on phone and eating. Asked mom to call LC for next feeding.  Patient Name: Karen Bender Date: 08/30/2022 Age:31 hours     Maternal Data    Feeding    LATCH Score                    Lactation Tools Discussed/Used    Interventions    Discharge    Consult Status      Charyl Dancer 08/30/2022, 9:13 PM

## 2022-08-30 NOTE — Anesthesia Preprocedure Evaluation (Signed)
Anesthesia Evaluation  Patient identified by MRN, date of birth, ID band Patient awake    Reviewed: Allergy & Precautions, NPO status , Patient's Chart, lab work & pertinent test results  Airway Mallampati: II  TM Distance: >3 FB Neck ROM: Full    Dental no notable dental hx.    Pulmonary neg pulmonary ROS   Pulmonary exam normal        Cardiovascular negative cardio ROS  Rhythm:Regular Rate:Normal     Neuro/Psych negative neurological ROS  negative psych ROS   GI/Hepatic negative GI ROS, Neg liver ROS,,,  Endo/Other  negative endocrine ROS    Renal/GU negative Renal ROS  negative genitourinary   Musculoskeletal negative musculoskeletal ROS (+)    Abdominal Normal abdominal exam  (+)   Peds  Hematology  (+) Blood dyscrasia, anemia Lab Results      Component                Value               Date                      WBC                      11.1 (H)            08/30/2022                HGB                      12.1                08/30/2022                HCT                      39.8                08/30/2022                MCV                      76.5 (L)            08/30/2022                PLT                      397                 08/30/2022              Anesthesia Other Findings   Reproductive/Obstetrics (+) Pregnancy                             Anesthesia Physical Anesthesia Plan  ASA: 2  Anesthesia Plan: Epidural   Post-op Pain Management:    Induction:   PONV Risk Score and Plan: 2 and Treatment may vary due to age or medical condition  Airway Management Planned: Natural Airway  Additional Equipment: None  Intra-op Plan:   Post-operative Plan:   Informed Consent: I have reviewed the patients History and Physical, chart, labs and discussed the procedure including the risks, benefits and alternatives for the proposed anesthesia with the patient or authorized  representative who has indicated his/her understanding and acceptance.  Dental advisory given  Plan Discussed with:   Anesthesia Plan Comments:        Anesthesia Quick Evaluation

## 2022-08-30 NOTE — Anesthesia Procedure Notes (Signed)
Epidural Patient location during procedure: OB Start time: 08/30/2022 10:40 AM End time: 08/30/2022 10:45 AM  Staffing Anesthesiologist: Atilano Median, DO Performed: anesthesiologist   Preanesthetic Checklist Completed: patient identified, IV checked, site marked, risks and benefits discussed, surgical consent, monitors and equipment checked, pre-op evaluation and timeout performed  Epidural Patient position: sitting Prep: ChloraPrep Patient monitoring: heart rate, continuous pulse ox and blood pressure Approach: midline Location: L4-L5 Injection technique: LOR saline  Needle:  Needle type: Tuohy  Needle gauge: 17 G Needle length: 9 cm Needle insertion depth: 4 cm Catheter type: closed end flexible Catheter size: 20 Guage Catheter at skin depth: 11 cm Test dose: negative and 1.5% lidocaine with Epi 1:200 K  Assessment Events: blood not aspirated, no cerebrospinal fluid, injection not painful, no injection resistance and no paresthesia  Additional Notes   Patient identified. Risks/Benefits/Options discussed with patient including but not limited to bleeding, infection, nerve damage, paralysis, failed block, incomplete pain control, headache, blood pressure changes, nausea, vomiting, reactions to medications, itching and postpartum back pain. Confirmed with bedside nurse the patient's most recent platelet count. Confirmed with patient that they are not currently taking any anticoagulation, have any bleeding history or any family history of bleeding disorders. Patient expressed understanding and wished to proceed. All questions were answered. Sterile technique was used throughout the entire procedure. Please see nursing notes for vital signs. Test dose was given through epidural catheter and negative prior to continuing to dose epidural or start infusion. Warning signs of high block given to the patient including shortness of breath, tingling/numbness in hands, complete motor  block, or any concerning symptoms with instructions to call for help. Patient was given instructions on fall risk and not to get out of bed. All questions and concerns addressed with instructions to call with any issues or inadequate analgesia.    Reason for block:procedure for pain

## 2022-08-30 NOTE — H&P (Signed)
OBSTETRIC ADMISSION HISTORY AND PHYSICAL  Karen Bender is a 32 y.o. female G2P1001 with IUP at [redacted]w[redacted]d by LMP presenting for spontaneous labor/SROM at 0849 on 08/30/2022. She reports +FMs, No LOF, no VB, no blurry vision, headaches or peripheral edema, and RUQ pain.  She plans on breast feeding. She request nothing for birth control. She received her prenatal care at [facility]  Dating: By LMP --->  Estimated Date of Delivery: 09/05/22  Sono:    @35  w 4 d, CWD, normal anatomy, cephalic presentation, 2445 g, 21% EFW   Prenatal History/Complications:  Patient Active Problem List   Diagnosis Date Noted   Normal labor 08/30/2022   Anemia complicating pregnancy 07/31/2022   Supervision of other normal pregnancy, antepartum 02/11/2022    Nursing Staff Provider  Office Location [place] Dating  09/03/2022, by Last Menstrual Period  [facility] Model [x]  Traditional [ ]  Centering [ ]  Mom-Baby Dyad    Language  English Anatomy US   Normal, female  Flu Vaccine  Done 02/13/22 Genetic/Carrier Screen  NIPS:   low risks AFP:    Horizon: neg x 4  TDaP Vaccine   07/03/22 Hgb A1C or  GTT Early  Third trimester normal  COVID Vaccine    LAB RESULTS   Rhogam  A/Positive/-- (01/31 1149)  Blood Type A/Positive/-- (01/31 1149)   Baby Feeding Plan  breast Antibody Negative (01/31 1149)  Contraception Declines Rubella 13.90 (01/31 1149)  Circumcision NA RPR Non Reactive (06/03 0902)   Pediatrician  [facility]  HBsAg Negative (01/31 1149)   Support Person Muhammad HCVAb Non Reactive (01/31 1149)   Prenatal Classes  HIV Non Reactive (06/03 0902)     BTL Consent  GBS  Positive  VBAC Consent  Pap Diagnosis  Date Value Ref Range Status  11/03/2019   Final   - Negative for intraepithelial lesion or malignancy (NILM)         DME Rx [ ]  BP cuff [ ]  Weight Scale Waterbirth  [ ]  Class [ ]  Consent [ ]  CNM visit  PHQ9 & GAD7 [x  ] new OB [ x ] 28 weeks  [  ] 36 weeks Induction  [ ]  Orders Entered [ ] Foley  Y/N     Past Medical History: Past Medical History:  Diagnosis Date   Anemia     Past Surgical History: Past Surgical History:  Procedure Laterality Date   NO PAST SURGERIES      Obstetrical History: OB History     Gravida  2   Para  1   Term  1   Preterm  0   AB  0   Living  1      SAB  0   IAB  0   Ectopic  0   Multiple  0   Live Births  1           Social History Social History   Socioeconomic History   Marital status: Married    Spouse name: Not on file   Number of children: Not on file   Years of education: Not on file   Highest education level: Not on file  Occupational History   Not on file  Tobacco Use   Smoking status: Never   Smokeless tobacco: Never  Vaping Use   Vaping status: Never Used  Substance and Sexual Activity   Alcohol use: No   Drug use: Never   Sexual activity: Yes    Birth control/protection: None  Other  Topics Concern   Not on file  Social History Narrative   Not on file   Social Determinants of Health   Financial Resource Strain: Not on file  Food Insecurity: Not on file  Transportation Needs: Not on file  Physical Activity: Not on file  Stress: Not on file  Social Connections: Not on file    Family History: Family History  Problem Relation Age of Onset   Diabetes Mother    Hypertension Mother    Healthy Father     Allergies: No Known Allergies  Medications Prior to Admission  Medication Sig Dispense Refill Last Dose   ferrous sulfate 325 (65 FE) MG EC tablet Take 1 tablet (325 mg total) by mouth every other day. 30 tablet 3 Past Week   Prenatal Vit-Fe Fumarate-FA (MULTIVITAMIN-PRENATAL) 27-0.8 MG TABS tablet Take 1 tablet by mouth daily at 12 noon.   Past Week     Review of Systems   All systems reviewed and negative except as stated in HPI  Blood pressure 126/83, pulse (!) 103, temperature 98.3 F (36.8 C), temperature source Oral, resp. rate 16, height 5\' 3"  (1.6 m), weight 52.6 kg,  last menstrual period 11/27/2021, SpO2 100%, unknown if currently breastfeeding. General appearance: alert, cooperative, appears stated age, and mild distress Lungs: clear to auscultation bilaterally Heart: regular rate and rhythm Abdomen: soft, non-tender; bowel sounds normal Pelvic: No lesions Extremities: Homans sign is negative, no sign of DVT  Presentation: cephalic Fetal monitoring Baseline: 140 bpm, Variability: Good {> 6 bpm), Accelerations: Reactive, and Decelerations: Absent Uterine activity every 1 to 2 minutes Dilation: 4 Effacement (%): 80 Station: -1 Exam by:: Doreatha Massed, RNC   Prenatal labs: ABO, Rh: --/--/PENDING (08/16 0915) Antibody: PENDING (08/16 0915) Rubella: 13.90 (01/31 1149) RPR: Non Reactive (06/03 0902)  HBsAg: Negative (01/31 1149)  HIV: Non Reactive (06/03 0902)  GBS: Positive/-- (07/29 1021)  1 hr Glucola normal Genetic screening low risk Anatomy US normal  Prenatal Transfer Tool  Maternal Diabetes: No Genetic Screening: Normal Maternal Ultrasounds/Referrals: Normal Fetal Ultrasounds or other Referrals:  None Maternal Substance Abuse:  No Significant Maternal Medications:  None Significant Maternal Lab Results:  Group B Strep positive Number of Prenatal Visits:greater than 3 verified prenatal visits Other Comments:  None  Results for orders placed or performed during the hospital encounter of 08/30/22 (from the past 24 hour(s))  Type and screen [facility]   Collection Time: 08/30/22  9:15 AM  Result Value Ref Range   ABO/RH(D) PENDING    Antibody Screen PENDING    Sample Expiration      09/02/2022,2359 Performed at [facility], [address] [place], [place] [address]   CBC   Collection Time: 08/30/22  9:17 AM  Result Value Ref Range   WBC 11.1 (H) 4.0 - 10.5 K/uL   RBC 5.20 (H) 3.87 - 5.11 MIL/uL   Hemoglobin 12.1 12.0 - 15.0 g/dL   HCT 60.4 54.0 - 98.1 %   MCV 76.5 (L) 80.0 - 100.0 fL   MCH 23.3 (L)  26.0 - 34.0 pg   MCHC 30.4 30.0 - 36.0 g/dL   RDW 19.1 (H) 47.8 - 29.5 %   Platelets 397 150 - 400 K/uL   nRBC 0.0 0.0 - 0.2 %    Patient Active Problem List   Diagnosis Date Noted   Normal labor 08/30/2022   Anemia complicating pregnancy 07/31/2022   Supervision of other normal pregnancy, antepartum 02/11/2022    Assessment/Plan:  Jariyah Kassel is a 32 y.o. G2P1001 at [redacted]w[redacted]d here for plan spontaneous labor/SROM 8:49 AM on 8/16  #Labor: Expectant management #Pain: Per patient request #FWB: Category 1 #ID:  GBS positive-start ampicillin #MOF: breast #MOC: None  Myrtie Hawk, DO  08/30/2022, 9:58 AM

## 2022-08-31 LAB — CBC
HCT: 33.5 % — ABNORMAL LOW (ref 36.0–46.0)
Hemoglobin: 10.2 g/dL — ABNORMAL LOW (ref 12.0–15.0)
MCH: 22.7 pg — ABNORMAL LOW (ref 26.0–34.0)
MCHC: 30.4 g/dL (ref 30.0–36.0)
MCV: 74.6 fL — ABNORMAL LOW (ref 80.0–100.0)
Platelets: 339 10*3/uL (ref 150–400)
RBC: 4.49 MIL/uL (ref 3.87–5.11)
RDW: 23.6 % — ABNORMAL HIGH (ref 11.5–15.5)
WBC: 14.1 10*3/uL — ABNORMAL HIGH (ref 4.0–10.5)
nRBC: 0 % (ref 0.0–0.2)

## 2022-08-31 MED ORDER — IBUPROFEN 600 MG PO TABS: 600.0000 mg | ORAL_TABLET | Freq: Four times a day (QID) | ORAL | 0 refills | Status: AC | PRN

## 2022-08-31 MED ORDER — FERROUS SULFATE 325 (65 FE) MG PO TBEC: 325.0000 mg | DELAYED_RELEASE_TABLET | ORAL | Status: AC

## 2022-08-31 NOTE — Anesthesia Postprocedure Evaluation (Signed)
Anesthesia Post Note  Patient: Karen Bender  Procedure(s) Performed: AN AD HOC LABOR EPIDURAL     Patient location during evaluation: Mother Baby Anesthesia Type: Epidural Level of consciousness: awake and alert Pain management: pain level controlled Vital Signs Assessment: post-procedure vital signs reviewed and stable Respiratory status: spontaneous breathing, nonlabored ventilation and respiratory function stable Cardiovascular status: stable Postop Assessment: no headache, no backache and epidural receding Anesthetic complications: no   No notable events documented.  Last Vitals:  Vitals:   08/31/22 0034 08/31/22 0504  BP: 103/70 108/79  Pulse: 97 95  Resp: 16 17  Temp: 36.4 C (!) 36.3 C  SpO2: 97% 100%    Last Pain:  Vitals:   08/31/22 0754  TempSrc:   PainSc: 0-No pain   Pain Goal:                   Salome Arnt

## 2022-08-31 NOTE — Lactation Note (Signed)
This note was copied from a baby's chart. Lactation Consultation Note  Patient Name: Karen Bender [id number] Date: 08/31/2022 Age:31 hours Reason for consult: Initial assessment;Difficult latch;Term;Infant weight loss (4 % weight loss, P2 , DL for the right breast only due to semi inverted nipple . semi compressible areola .) Per mom desire early D/C today at 24 hours.  Per mom only latching on the left breast due to inverted nipple on the right.  LC offered to assess and mom receptive.  LC noted the nipple to be semi inverted and the areola to be semi compressible. LC provided breast shells while awake.  LC reassured mom latching on the right will take some time due to the inverted nipple . ( Which mom is aware )  LC recommended for now just feed on the left and supplement on pace feeding. Wear the shells between feedings, and if dyad isn't D/C a DEBP for post pumping.  Per mom had a difficult time with her 1st baby latching on the right breast due to the inverted nipple. Used a NS.  Mom aware to call with feeding cues for [facility] assessment.  Maternal Data Does the patient have breastfeeding experience prior to this delivery?: Yes How long did the patient breastfeed?: per mom difficult latch on the right breast due to the invertion, had to use a NS and ended up just pumping for a few months and switching to formula  Feeding Mother's Current Feeding Choice: Breast Milk and Formula Nipple Type: Slow - flow  LATCH Score - according to the doc flow sheets - Latch score 9    Lactation Tools Discussed/Used Tools: Shells;Pump;Flanges Flange Size: 21 Breast pump type: Manual Pump Education: Milk Storage;Setup, frequency, and cleaning  Interventions Interventions: Breast feeding basics reviewed;Shells;Hand pump;Education;LC Services brochure  Discharge Pump: Hands Free;Personal;Manual WIC Program: No  Consult Status Consult Status: Follow-up Date: 08/31/22 Follow-up type:  In-patient    Matilde Sprang Siedah Sedor 08/31/2022, 9:17 AM

## 2022-08-31 NOTE — Progress Notes (Signed)
Daily Post Partum Note  08/31/2022 Karen Bender is a 32 y.o. N5A2130 PPD#1 s/p SVD/2nd degree at [redacted]w[redacted]d.  Pregnancy c/b anemia with h/o venofer during the pregnancy 24hr/overnight events:  none  Subjective:  Meeting all PP goals  Objective:    Current Vital Signs 24h Vital Sign Ranges  T (!) 97.4 F (36.3 C) Temp  Avg: 97.9 F (36.6 C)  Min: 97.4 F (36.3 C)  Max: 98.4 F (36.9 C)  BP 108/79 BP  Min: 90/47  Max: 127/90  HR 95 Pulse  Avg: 100.4  Min: 78  Max: 123  RR 17 Resp  Avg: 17.5  Min: 16  Max: 18  SaO2 100 %   SpO2  Avg: 99.4 %  Min: 97 %  Max: 100 %       24 Hour I/O Current Shift I/O  Time Ins Outs 08/16 0701 - 08/17 0700 In: -  Out: 300  No intake/output data recorded.   General: NAD Abdomen: soft, nttp.  Perineum: deferred Skin:  Warm and dry.  Respiratory:  Normal respiratory effort  Medications Current Facility-Administered Medications  Medication Dose Route Frequency Provider Last Rate Last Admin   sodium chloride flush (NS) 0.9 % injection 3 mL  3 mL Intravenous Q12H Wyn Forster, MD       And   sodium chloride flush (NS) 0.9 % injection 3 mL  3 mL Intravenous PRN Wyn Forster, MD       And   0.9 %  sodium chloride infusion  250 mL Intravenous PRN Wyn Forster, MD       acetaminophen (TYLENOL) tablet 650 mg  650 mg Oral Q4H PRN Wyn Forster, MD       benzocaine-Menthol (DERMOPLAST) 20-0.5 % topical spray 1 Application  1 Application Topical PRN Wyn Forster, MD   1 Application at 08/30/22 1833   coconut oil  1 Application Topical PRN Wyn Forster, MD       witch hazel-glycerin (TUCKS) pad 1 Application  1 Application Topical PRN Wyn Forster, MD   1 Application at 08/30/22 1833   And   dibucaine (NUPERCAINAL) 1 % rectal ointment 1 Application  1 Application Rectal PRN Wyn Forster, MD       diphenhydrAMINE (BENADRYL) capsule 25 mg  25 mg Oral Q6H PRN Wyn Forster, MD       ibuprofen (ADVIL) tablet 600 mg  600 mg Oral Q6H  Wyn Forster, MD   600 mg at 08/31/22 0503   ondansetron (ZOFRAN) tablet 4 mg  4 mg Oral Q4H PRN Wyn Forster, MD       Or   ondansetron (ZOFRAN) injection 4 mg  4 mg Intravenous Q4H PRN Wyn Forster, MD       oxyCODONE-acetaminophen (PERCOCET/ROXICET) 5-325 MG per tablet 1 tablet  1 tablet Oral Q4H PRN Hermina Staggers, MD       oxyCODONE-acetaminophen (PERCOCET/ROXICET) 5-325 MG per tablet 2 tablet  2 tablet Oral Q4H PRN Hermina Staggers, MD       prenatal multivitamin tablet 1 tablet  1 tablet Oral Q1200 Wyn Forster, MD       senna-docusate (Senokot-S) tablet 2 tablet  2 tablet Oral Q24H Wyn Forster, MD   2 tablet at 08/30/22 1630   simethicone (MYLICON) chewable tablet 80 mg  80 mg Oral PRN Wyn Forster, MD       Tdap (BOOSTRIX) injection 0.5 mL  0.5 mL Intramuscular Once Wyn Forster, MD       zolpidem (AMBIEN) tablet  5 mg  5 mg Oral QHS PRN Wyn Forster, MD        Labs:  Recent Labs  Lab 08/30/22 [phone] 08/31/22 0438  WBC 11.1* 14.1*  HGB 12.1 10.2*  HCT 39.8 33.5*  PLT 397 339    Assessment & Plan:  Patient doing well *Postpartum/postop: routine care. Patient okay for d/c to home today if baby can go. A POS/rpr neg/Girl/breast/unsure about birth control/GBS positive and pt got one dose of amp 2g   Cornelia Copa. MD Attending Center for [facility])

## 2022-09-03 ENCOUNTER — Encounter: Payer: 59 | Admitting: Obstetrics and Gynecology

## 2022-09-26 ENCOUNTER — Telehealth (HOSPITAL_COMMUNITY): Payer: Self-pay | Admitting: *Deleted

## 2022-09-26 NOTE — Telephone Encounter (Signed)
09/26/2022  Name: Karen Bender MRN: [id number] DOB: 1990-05-07  Reason for Call:  Transition of Care Hospital Discharge Call  Contact Status: Patient Contact Status: Complete  Language assistant needed: Interpreter Mode: Interpreter Not Needed        Follow-Up Questions: Do You Have Any Concerns About Your Health As You Heal From Delivery?: No Do You Have Any Concerns About Your Infants Health?: No  Edinburgh Postnatal Depression Scale:  In the Past 7 Days: I have been able to laugh and see the funny side of things.: As much as I always could I have looked forward with enjoyment to things.: As much as I ever did I have blamed myself unnecessarily when things went wrong.: No, never I have been anxious or worried for no good reason.: No, not at all I have felt scared or panicky for no good reason.: No, not at all Things have been getting on top of me.: No, I have been coping as well as ever I have been so unhappy that I have had difficulty sleeping.: Not at all I have felt sad or miserable.: No, not at all I have been so unhappy that I have been crying.: No, never The thought of harming myself has occurred to me.: Never Inocente Salles Postnatal Depression Scale Total: 0  PHQ2-9 Depression Scale:     Discharge Follow-up: Edinburgh score requires follow up?: No Patient was advised of the following resources:: Breastfeeding Support Group, Support Group  Post-discharge interventions: Reviewed Newborn Safe Sleep Practices  Salena Saner, RN 09/26/2022 13:57

## 2022-10-01 ENCOUNTER — Ambulatory Visit (INDEPENDENT_AMBULATORY_CARE_PROVIDER_SITE_OTHER): Payer: 59 | Admitting: Obstetrics and Gynecology

## 2022-10-01 VITALS — BP 102/69 | HR 89 | Ht 63.0 in | Wt 98.0 lb

## 2022-10-01 DIAGNOSIS — Z23 Encounter for immunization: Secondary | ICD-10-CM | POA: Diagnosis not present

## 2022-10-01 NOTE — Progress Notes (Signed)
    Post Partum Visit Note  Karen Bender is a 32 y.o. G42P2002 female who presents for a postpartum visit. She is 4 weeks postpartum following a normal spontaneous vaginal delivery.  I have fully reviewed the prenatal and intrapartum course. The delivery was at 39.1 gestational weeks.  Anesthesia: epidural. Postpartum course has been unremarkable. Baby is doing well. Baby is feeding by both breast and bottle - Similac Advance. Bleeding brown. Bowel function is normal. Bladder function is normal. Patient is sexually active. Contraception method is none. Postpartum depression screening: negative.   The pregnancy intention screening data noted above was reviewed. Potential methods of contraception were discussed. The patient elected to proceed with No data recorded.    Health Maintenance Due  Topic Date Due   INFLUENZA VACCINE  08/15/2022   COVID-19 Vaccine (3 - 2023-24 season) 09/15/2022   Cervical Cancer Screening (HPV/Pap Cotest)  11/03/2022    The following portions of the patient's history were reviewed and updated as appropriate: allergies, current medications, past family history, past medical history, past social history, past surgical history, and problem list.  Review of Systems Pertinent items are noted in HPI.  Objective:  LMP 11/27/2021    General:  alert, cooperative, and appears stated age   Breasts:  not indicated  Lungs: normal percussion bilaterally  Abdomen: soft, non-tender; bowel sounds normal; no masses,  no organomegaly   GU exam:  not indicated       Assessment:    Normal postpartum exam.   Plan:   Essential components of care per ACOG recommendations:  1.  Mood and well being: Patient with negative depression screening today. Reviewed local resources for support.  - Patient tobacco use? No.   - hx of drug use? No.    2. Infant care and feeding:  -Patient currently breastmilk feeding? Yes. Reviewed importance of draining breast regularly to support  lactation.   -Social determinants of health (SDOH) reviewed in EPIC. No concerns 3. Sexuality, contraception and birth spacing - Patient does not know want a pregnancy in the next year.  Desired family size is unsure children.  - Reviewed reproductive life planning. Reviewed contraceptive methods based on pt preferences and effectiveness.  Patient desired Abstinence today.  She is considering other options for [facility].  - Discussed birth spacing of 18 months  4. Sleep and fatigue -Encouraged family/partner/community support of 4 hrs of uninterrupted sleep to help with mood and fatigue  5. Physical Recovery  - Discussed patients delivery and complications. She describes her labor as good./fast - Patient had a Vaginal, no problems at delivery. Patient had a 2nd degree laceration. Perineal healing reviewed. Patient expressed understanding - Patient has urinary incontinence? No. - Patient is safe to resume physical and sexual activity  6.  Health Maintenance - HM due items addressed Yes - Last pap smear  Diagnosis  Date Value Ref Range Status  11/03/2019   Final   - Negative for intraepithelial lesion or malignancy (NILM)   Pap smear not done at today's visit.  -Breast Cancer screening indicated? No.   7. Chronic Disease/Pregnancy Condition follow up: None  Call the office for annual pap after 10/2022 - PCP follow up   Steward Sames, Harolyn Rutherford, NP Center for [facility], [facility]

## 2022-10-24 IMAGING — US US MFM OB FOLLOW-UP
1 series · 13 of 28 positions shown · non-contrast
Comparison: none

[Series 1: us mfm ob follow-up · 77 acquisitions, 13 frames shown]
[im 3/77]
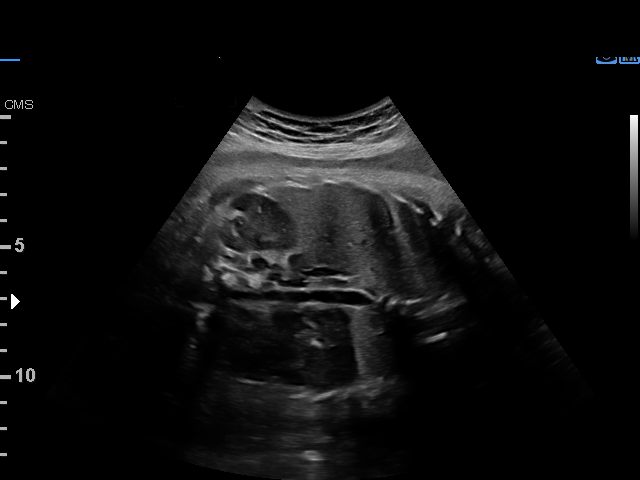
[im 9/77]
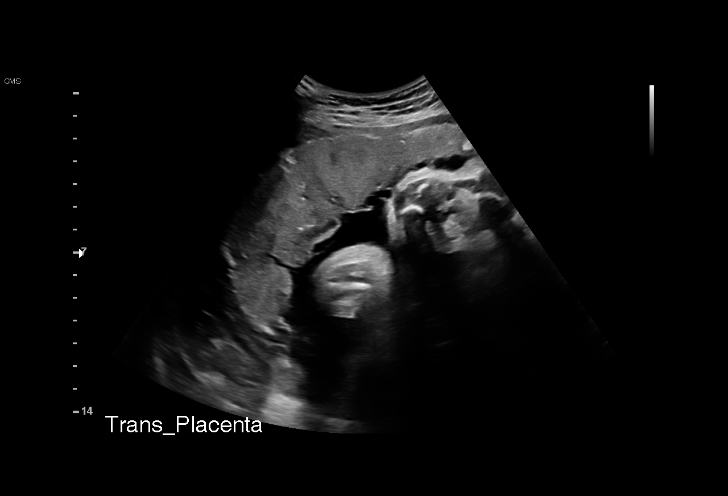
[im 15/77]
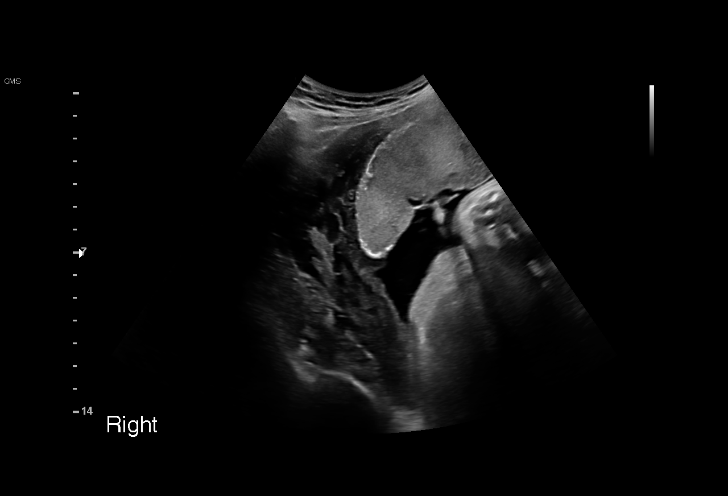
[im 20/77]
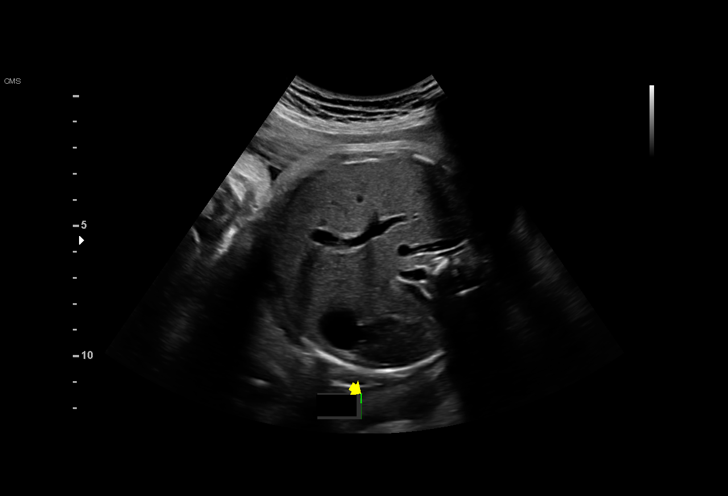
[im 26/77]
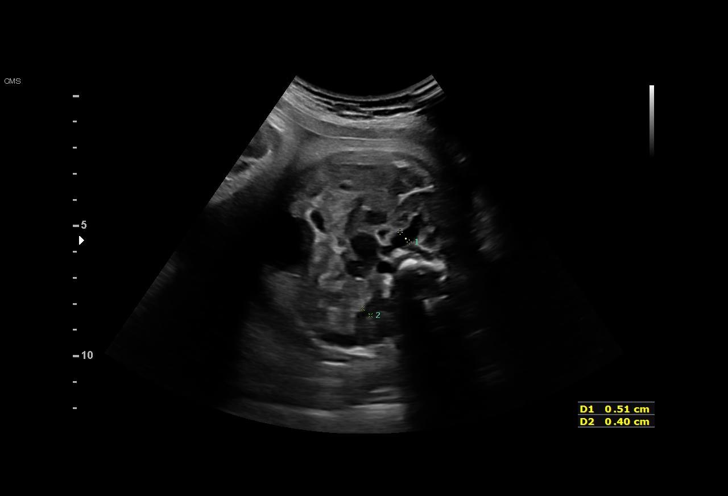
[im 31/77]
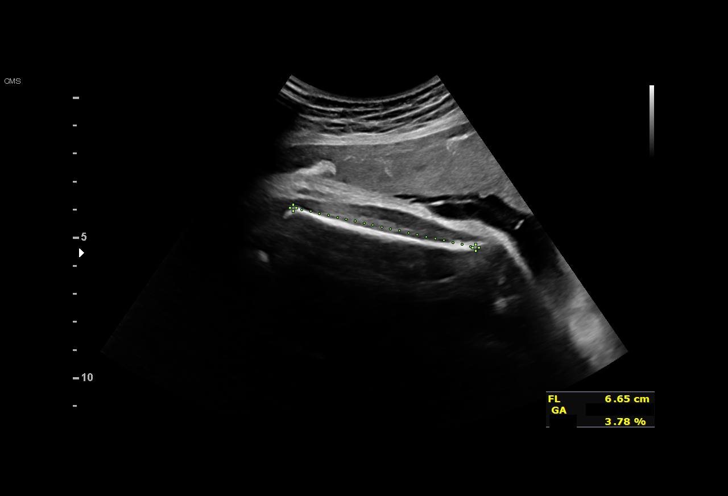
[im 40/77]
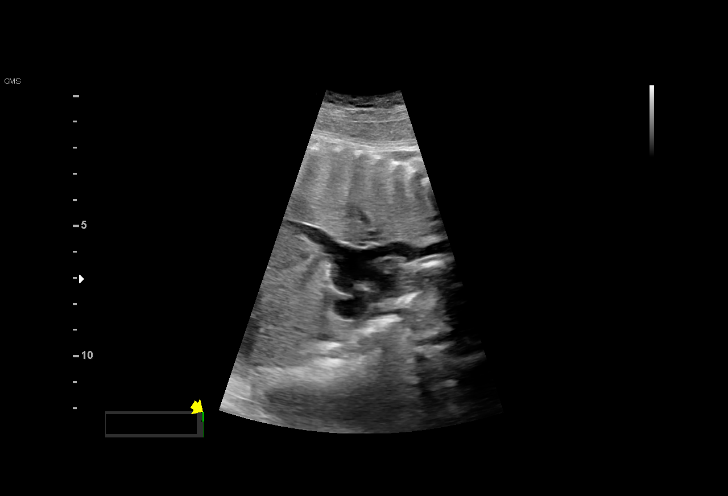
[im 46/77]
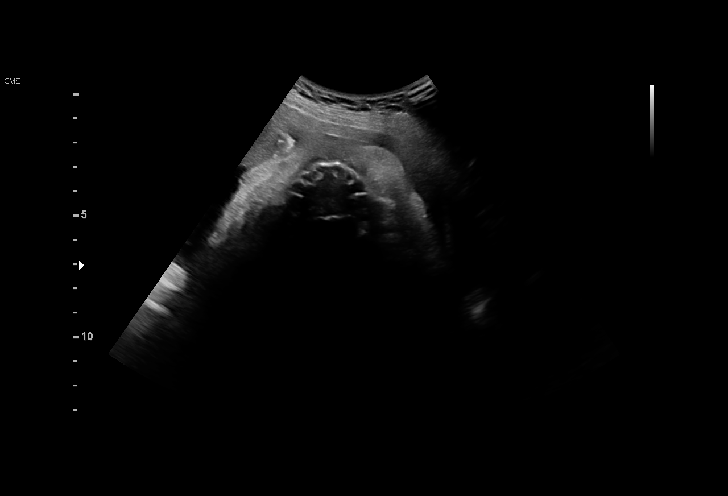
[im 51/77]
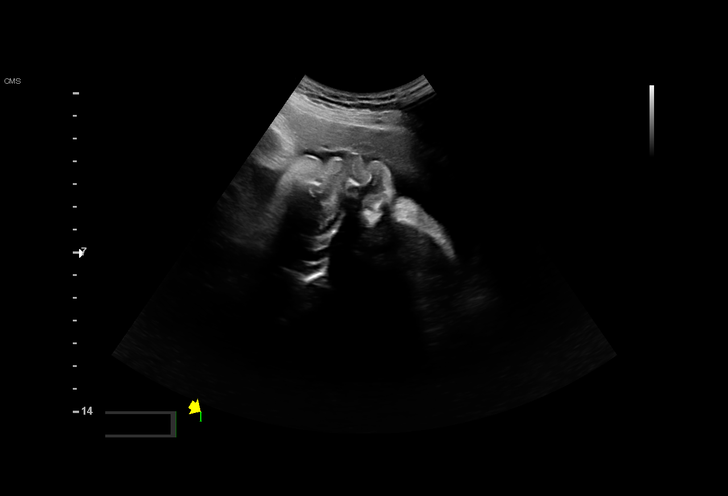
[im 57/77]
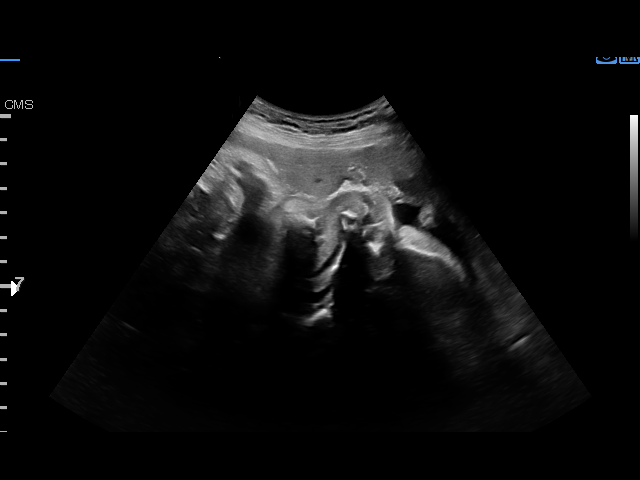
[im 62/77]
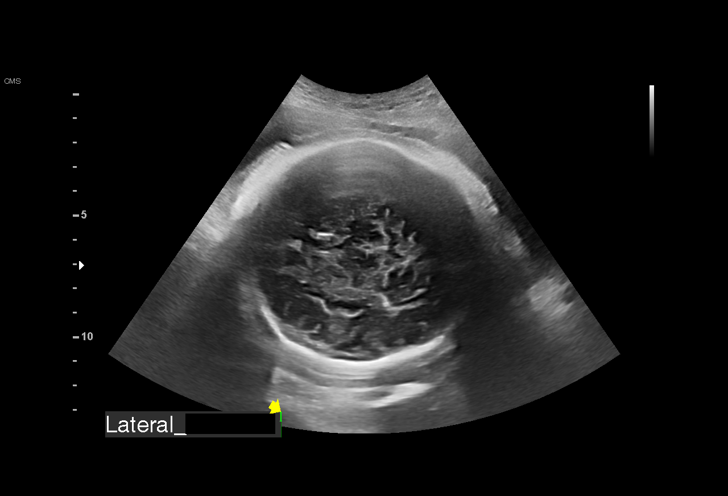
[im 68/77]
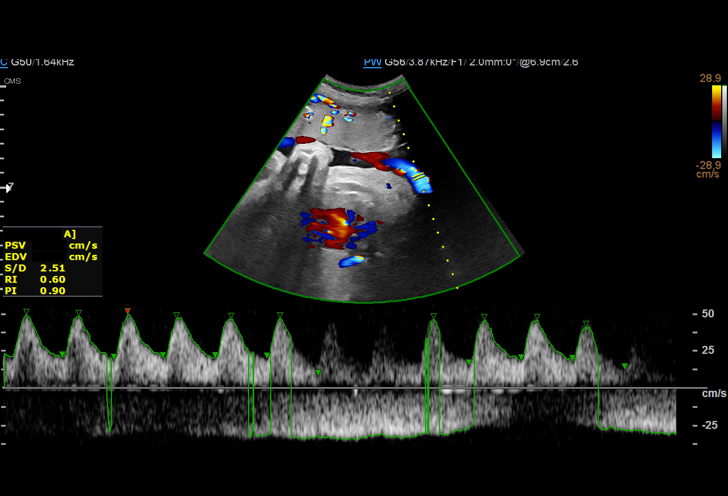
[im 74/77]
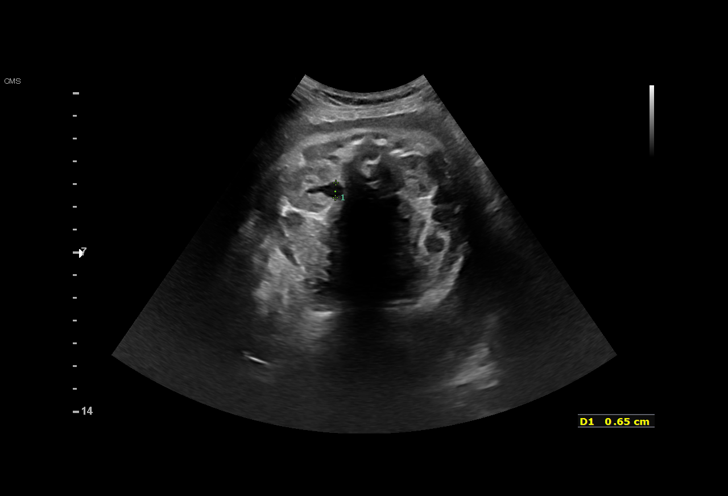

[13 of 28 positions shown; findings below may reference images not displayed]

Indications

 Uterine size-date discrepancy, third trimester
 (S<D)
 36 weeks gestation of pregnancy
 Pyelectasis of fetus on prenatal ultrasound
 LR NIPS female
Fetal Evaluation

 Num Of Fetuses:         1
 Fetal Heart Rate(bpm):  131
 Cardiac Activity:       Observed
 Presentation:           Cephalic
 Placenta:               Anterior
 P. Cord Insertion:      Previously Visualized

 Amniotic Fluid
 AFI FV:      Within normal limits

 AFI Sum(cm)     %Tile       Largest Pocket(cm)
 13.9            51

 RUQ(cm)       RLQ(cm)       LUQ(cm)        LLQ(cm)

Biometry

 BPD:      91.6  mm     G. Age:  37w 1d         75  %    CI:        80.39   %    70 - 86
                                                         FL/HC:      20.7   %    20.8 -
 HC:      322.7  mm     G. Age:  36w 3d         18  %    HC/AC:      1.05        0.92 -
 AC:      308.4  mm     G. Age:  34w 6d         13  %    FL/BPD:     72.9   %    71 - 87
 FL:       66.8  mm     G. Age:  34w 3d        4.6  %    FL/AC:      21.7   %    20 - 24
 HUM:        60  mm     G. Age:  34w 6d         32  %

 LV:        2.1  mm
 Est. FW:    5401  gm    5 lb 11 oz      16  %
OB History

 Gravidity:    1
Gestational Age

 LMP:           36w 5d        Date:  03/08/20                 EDD:   12/13/20
 U/S Today:     35w 5d                                        EDD:   12/20/20
 Best:          36w 5d     Det. By:  LMP  (03/08/20)          EDD:   12/13/20
Anatomy

 Cranium:               Appears normal         LVOT:                   Appears normal
 Cavum:                 Appears normal         Aortic Arch:            Previously seen
 Ventricles:            Appears normal         Ductal Arch:            Previously seen
 Choroid Plexus:        Appears normal         Diaphragm:              Appears normal
 Cerebellum:            Appears normal         Stomach:                Appears normal, left
                                                                       sided
 Posterior Fossa:       Appears normal         Abdomen:                Appears normal
 Nuchal Fold:           Not applicable (>20    Abdominal Wall:         Previously seen
                        wks GA)
 Face:                  Appears normal         Cord Vessels:           Previously seen
                        (orbits and profile)
 Lips:                  Appears normal         Kidneys:                Appear normal
 Palate:                Appears normal         Bladder:                Appears normal
 Thoracic:              Appears normal         Spine:                  Previously seen
 Heart:                 Previously seen        Upper Extremities:      Previously seen
 RVOT:                  Appears normal         Lower Extremities:      Previously seen

 Other:  Previously fetus appears to be female. Nasal bone and lenses prev.
         visualized. Heels/feet and open hands prev. visualized. VC, 3VV and
         3VTV prev. visualized.
Doppler - Fetal Vessels

 Umbilical Artery
  S/D     %tile      RI    %tile      PI    %tile     PSV    ADFV    RDFV
                                                    (cm/s)
  2.83       78    0.65       83    0.[REDACTED]      No      No

Cervix Uterus Adnexa

 Cervix
 Not visualized (advanced GA >67wks)

 Uterus
 No abnormality visualized.

 Right Ovary
 Not visualized.
 Left Ovary
 Not visualized.

 Cul De Sac
 No free fluid seen.

 Adnexa
 No abnormality visualized.
Comments

 This patient was seen for a follow up growth scan as her
 fundal heights have been measuring less than her dates.
 She denies other any problems in her current pregnancy.
 She reports feeling fetal movements throughout the day.
 She was informed that the fetal growth and amniotic fluid
 level appears appropriate for her gestational age.  The overall
 EFW 5 pounds 11 ounces measures at the 16th percentile for
 her gestational age.
 Doppler studies of the umbilical arteries performed today
 continues to show normal forward flow.  There were no signs
 of absent or reversed end-diastolic flow.
 Should there be any further concerns regarding the fetal
 growth, delivery may be considered at 39 weeks.
 No further exams were scheduled in our office.

## 2022-12-10 ENCOUNTER — Ambulatory Visit: Payer: 59 | Admitting: Obstetrics and Gynecology
